# Patient Record
Sex: Male | Born: 1940 | Race: Black or African American | Hispanic: No | Smoking: Former smoker
Health system: Southern US, Community
[De-identification: ages and names within clinical notes are randomized; demographics above are authoritative.]

## PROBLEM LIST (undated history)

## (undated) DIAGNOSIS — F0390 Unspecified dementia without behavioral disturbance: Secondary | ICD-10-CM

## (undated) DIAGNOSIS — N189 Chronic kidney disease, unspecified: Secondary | ICD-10-CM

## (undated) DIAGNOSIS — M109 Gout, unspecified: Secondary | ICD-10-CM

## (undated) DIAGNOSIS — S72001A Fracture of unspecified part of neck of right femur, initial encounter for closed fracture: Secondary | ICD-10-CM

## (undated) DIAGNOSIS — I10 Essential (primary) hypertension: Secondary | ICD-10-CM

## (undated) DIAGNOSIS — I219 Acute myocardial infarction, unspecified: Secondary | ICD-10-CM

## (undated) DIAGNOSIS — C801 Malignant (primary) neoplasm, unspecified: Secondary | ICD-10-CM

## (undated) DIAGNOSIS — E119 Type 2 diabetes mellitus without complications: Secondary | ICD-10-CM

## (undated) HISTORY — PX: OTHER SURGICAL HISTORY: SHX169

## (undated) HISTORY — PX: PROSTATE SURGERY: SHX751

---

## 1995-06-11 DIAGNOSIS — Z8546 Personal history of malignant neoplasm of prostate: Secondary | ICD-10-CM

## 1999-07-11 ENCOUNTER — Encounter: Payer: Self-pay | Admitting: Family Medicine

## 1999-07-11 ENCOUNTER — Ambulatory Visit (HOSPITAL_COMMUNITY): Admission: RE | Admit: 1999-07-11 | Discharge: 1999-07-11 | Payer: Self-pay | Admitting: Family Medicine

## 2001-12-02 ENCOUNTER — Emergency Department (HOSPITAL_COMMUNITY): Admission: EM | Admit: 2001-12-02 | Discharge: 2001-12-03 | Payer: Self-pay | Admitting: Emergency Medicine

## 2001-12-09 ENCOUNTER — Emergency Department (HOSPITAL_COMMUNITY): Admission: EM | Admit: 2001-12-09 | Discharge: 2001-12-09 | Payer: Self-pay | Admitting: Emergency Medicine

## 2002-02-25 ENCOUNTER — Encounter: Payer: Self-pay | Admitting: Nephrology

## 2002-02-25 ENCOUNTER — Encounter: Admission: RE | Admit: 2002-02-25 | Discharge: 2002-02-25 | Payer: Self-pay | Admitting: Nephrology

## 2002-04-01 ENCOUNTER — Encounter: Payer: Self-pay | Admitting: Ophthalmology

## 2002-04-01 ENCOUNTER — Ambulatory Visit (HOSPITAL_COMMUNITY): Admission: RE | Admit: 2002-04-01 | Discharge: 2002-04-02 | Payer: Self-pay | Admitting: Ophthalmology

## 2003-03-07 ENCOUNTER — Encounter: Admission: RE | Admit: 2003-03-07 | Discharge: 2003-03-07 | Payer: Self-pay | Admitting: Urology

## 2003-03-07 ENCOUNTER — Encounter: Payer: Self-pay | Admitting: Urology

## 2004-04-24 ENCOUNTER — Ambulatory Visit: Payer: Self-pay | Admitting: Family Medicine

## 2004-05-08 ENCOUNTER — Ambulatory Visit: Payer: Self-pay | Admitting: Family Medicine

## 2004-05-15 ENCOUNTER — Ambulatory Visit: Payer: Self-pay | Admitting: Family Medicine

## 2004-08-19 ENCOUNTER — Inpatient Hospital Stay (HOSPITAL_COMMUNITY): Admission: EM | Admit: 2004-08-19 | Discharge: 2004-08-24 | Payer: Self-pay

## 2004-08-19 ENCOUNTER — Ambulatory Visit: Payer: Self-pay | Admitting: Internal Medicine

## 2004-08-27 ENCOUNTER — Ambulatory Visit: Payer: Self-pay | Admitting: Family Medicine

## 2004-09-03 ENCOUNTER — Ambulatory Visit: Payer: Self-pay | Admitting: Family Medicine

## 2004-10-03 ENCOUNTER — Ambulatory Visit: Payer: Self-pay | Admitting: Family Medicine

## 2004-10-23 ENCOUNTER — Ambulatory Visit (HOSPITAL_COMMUNITY): Admission: RE | Admit: 2004-10-23 | Discharge: 2004-10-24 | Payer: Self-pay | Admitting: Ophthalmology

## 2004-12-03 ENCOUNTER — Ambulatory Visit: Payer: Self-pay | Admitting: Family Medicine

## 2004-12-19 ENCOUNTER — Ambulatory Visit: Payer: Self-pay | Admitting: Family Medicine

## 2005-05-14 ENCOUNTER — Ambulatory Visit: Payer: Self-pay | Admitting: Family Medicine

## 2005-05-29 ENCOUNTER — Ambulatory Visit: Payer: Self-pay | Admitting: Internal Medicine

## 2005-09-12 ENCOUNTER — Emergency Department (HOSPITAL_COMMUNITY): Admission: EM | Admit: 2005-09-12 | Discharge: 2005-09-12 | Payer: Self-pay | Admitting: Emergency Medicine

## 2005-10-31 ENCOUNTER — Ambulatory Visit: Payer: Self-pay | Admitting: Family Medicine

## 2005-11-01 ENCOUNTER — Encounter: Payer: Self-pay | Admitting: Vascular Surgery

## 2005-11-01 ENCOUNTER — Ambulatory Visit: Admission: RE | Admit: 2005-11-01 | Discharge: 2005-11-01 | Payer: Self-pay | Admitting: Family Medicine

## 2005-11-12 ENCOUNTER — Ambulatory Visit: Payer: Self-pay | Admitting: Family Medicine

## 2006-01-07 ENCOUNTER — Ambulatory Visit: Payer: Self-pay | Admitting: Family Medicine

## 2006-02-27 ENCOUNTER — Ambulatory Visit: Payer: Self-pay | Admitting: Family Medicine

## 2006-03-25 ENCOUNTER — Ambulatory Visit: Payer: Self-pay | Admitting: Family Medicine

## 2006-04-26 ENCOUNTER — Emergency Department (HOSPITAL_COMMUNITY): Admission: EM | Admit: 2006-04-26 | Discharge: 2006-04-26 | Payer: Self-pay | Admitting: Emergency Medicine

## 2006-05-02 ENCOUNTER — Emergency Department (HOSPITAL_COMMUNITY): Admission: EM | Admit: 2006-05-02 | Discharge: 2006-05-02 | Payer: Self-pay | Admitting: Emergency Medicine

## 2006-09-23 ENCOUNTER — Ambulatory Visit: Payer: Self-pay | Admitting: Family Medicine

## 2006-09-26 ENCOUNTER — Ambulatory Visit (HOSPITAL_COMMUNITY): Admission: RE | Admit: 2006-09-26 | Discharge: 2006-09-26 | Payer: Self-pay | Admitting: Internal Medicine

## 2006-09-29 ENCOUNTER — Ambulatory Visit (HOSPITAL_COMMUNITY): Admission: RE | Admit: 2006-09-29 | Discharge: 2006-09-29 | Payer: Self-pay | Admitting: Family Medicine

## 2006-10-03 ENCOUNTER — Ambulatory Visit (HOSPITAL_COMMUNITY): Admission: RE | Admit: 2006-10-03 | Discharge: 2006-10-03 | Payer: Self-pay | Admitting: Family Medicine

## 2006-11-06 ENCOUNTER — Ambulatory Visit: Payer: Self-pay | Admitting: Internal Medicine

## 2007-01-07 ENCOUNTER — Emergency Department (HOSPITAL_COMMUNITY): Admission: EM | Admit: 2007-01-07 | Discharge: 2007-01-07 | Payer: Self-pay | Admitting: *Deleted

## 2007-03-30 ENCOUNTER — Telehealth (INDEPENDENT_AMBULATORY_CARE_PROVIDER_SITE_OTHER): Payer: Self-pay | Admitting: Family Medicine

## 2007-04-02 DIAGNOSIS — R319 Hematuria, unspecified: Secondary | ICD-10-CM | POA: Insufficient documentation

## 2007-04-02 DIAGNOSIS — E785 Hyperlipidemia, unspecified: Secondary | ICD-10-CM

## 2007-04-08 ENCOUNTER — Ambulatory Visit: Payer: Self-pay | Admitting: Family Medicine

## 2007-06-12 ENCOUNTER — Encounter (INDEPENDENT_AMBULATORY_CARE_PROVIDER_SITE_OTHER): Payer: Self-pay | Admitting: Family Medicine

## 2007-07-02 ENCOUNTER — Encounter (INDEPENDENT_AMBULATORY_CARE_PROVIDER_SITE_OTHER): Payer: Self-pay | Admitting: Family Medicine

## 2007-07-02 ENCOUNTER — Observation Stay (HOSPITAL_COMMUNITY): Admission: EM | Admit: 2007-07-02 | Discharge: 2007-07-03 | Payer: Self-pay | Admitting: Emergency Medicine

## 2007-07-10 ENCOUNTER — Telehealth (INDEPENDENT_AMBULATORY_CARE_PROVIDER_SITE_OTHER): Payer: Self-pay | Admitting: Family Medicine

## 2007-07-29 ENCOUNTER — Encounter (INDEPENDENT_AMBULATORY_CARE_PROVIDER_SITE_OTHER): Payer: Self-pay | Admitting: Family Medicine

## 2007-07-30 ENCOUNTER — Encounter (INDEPENDENT_AMBULATORY_CARE_PROVIDER_SITE_OTHER): Payer: Self-pay | Admitting: Family Medicine

## 2007-09-10 ENCOUNTER — Ambulatory Visit: Payer: Self-pay | Admitting: Internal Medicine

## 2007-09-10 ENCOUNTER — Encounter (INDEPENDENT_AMBULATORY_CARE_PROVIDER_SITE_OTHER): Payer: Self-pay | Admitting: Family Medicine

## 2007-09-10 ENCOUNTER — Emergency Department (HOSPITAL_COMMUNITY): Admission: EM | Admit: 2007-09-10 | Discharge: 2007-09-11 | Payer: Self-pay | Admitting: Emergency Medicine

## 2007-09-10 DIAGNOSIS — R55 Syncope and collapse: Secondary | ICD-10-CM | POA: Insufficient documentation

## 2007-09-10 DIAGNOSIS — D649 Anemia, unspecified: Secondary | ICD-10-CM

## 2007-09-10 DIAGNOSIS — E1129 Type 2 diabetes mellitus with other diabetic kidney complication: Secondary | ICD-10-CM | POA: Insufficient documentation

## 2007-09-10 LAB — CONVERTED CEMR LAB
Basophils Relative: 0 % (ref 0–1)
CO2: 26 meq/L (ref 19–32)
Calcium: 10.2 mg/dL (ref 8.4–10.5)
Chloride: 100 meq/L (ref 96–112)
Creatinine, Ser: 1.94 mg/dL — ABNORMAL HIGH (ref 0.40–1.50)
Eosinophils Absolute: 0.3 10*3/uL (ref 0.0–0.7)
Glucose, Bld: 109 mg/dL — ABNORMAL HIGH (ref 70–99)
HCT: 36.5 % — ABNORMAL LOW (ref 39.0–52.0)
Hemoglobin: 11.2 g/dL — ABNORMAL LOW (ref 13.0–17.0)
MCHC: 30.7 g/dL (ref 30.0–36.0)
Platelets: 267 10*3/uL (ref 150–400)
Potassium: 5 meq/L (ref 3.5–5.3)
RBC: 4.49 M/uL (ref 4.22–5.81)
Sodium: 139 meq/L (ref 135–145)
Total Bilirubin: 0.5 mg/dL (ref 0.3–1.2)
WBC: 7.3 10*3/uL (ref 4.0–10.5)

## 2007-09-11 ENCOUNTER — Encounter (INDEPENDENT_AMBULATORY_CARE_PROVIDER_SITE_OTHER): Payer: Self-pay | Admitting: Family Medicine

## 2007-09-11 ENCOUNTER — Telehealth (INDEPENDENT_AMBULATORY_CARE_PROVIDER_SITE_OTHER): Payer: Self-pay | Admitting: Internal Medicine

## 2007-09-22 ENCOUNTER — Telehealth (INDEPENDENT_AMBULATORY_CARE_PROVIDER_SITE_OTHER): Payer: Self-pay | Admitting: Family Medicine

## 2007-09-22 ENCOUNTER — Ambulatory Visit: Payer: Self-pay | Admitting: Internal Medicine

## 2007-09-22 DIAGNOSIS — N189 Chronic kidney disease, unspecified: Secondary | ICD-10-CM

## 2007-09-22 LAB — CONVERTED CEMR LAB
Blood Glucose, Fingerstick: 261
Iron: 75 ug/dL (ref 42–165)
Saturation Ratios: 22 % (ref 20–55)
TIBC: 347 ug/dL (ref 215–435)
UIBC: 272 ug/dL

## 2007-10-09 ENCOUNTER — Telehealth (INDEPENDENT_AMBULATORY_CARE_PROVIDER_SITE_OTHER): Payer: Self-pay | Admitting: Family Medicine

## 2007-10-23 ENCOUNTER — Inpatient Hospital Stay (HOSPITAL_COMMUNITY): Admission: EM | Admit: 2007-10-23 | Discharge: 2007-10-24 | Payer: Self-pay | Admitting: Emergency Medicine

## 2007-10-28 ENCOUNTER — Encounter (INDEPENDENT_AMBULATORY_CARE_PROVIDER_SITE_OTHER): Payer: Self-pay | Admitting: Family Medicine

## 2007-11-13 ENCOUNTER — Telehealth (INDEPENDENT_AMBULATORY_CARE_PROVIDER_SITE_OTHER): Payer: Self-pay | Admitting: Family Medicine

## 2007-11-16 ENCOUNTER — Telehealth (INDEPENDENT_AMBULATORY_CARE_PROVIDER_SITE_OTHER): Payer: Self-pay | Admitting: *Deleted

## 2007-11-23 ENCOUNTER — Encounter (INDEPENDENT_AMBULATORY_CARE_PROVIDER_SITE_OTHER): Payer: Self-pay | Admitting: Family Medicine

## 2007-12-22 ENCOUNTER — Ambulatory Visit: Payer: Self-pay | Admitting: Family Medicine

## 2007-12-22 DIAGNOSIS — R413 Other amnesia: Secondary | ICD-10-CM

## 2007-12-22 LAB — CONVERTED CEMR LAB
Blood Glucose, Fingerstick: 195
Hgb A1c MFr Bld: 7.8 %

## 2007-12-25 ENCOUNTER — Telehealth (INDEPENDENT_AMBULATORY_CARE_PROVIDER_SITE_OTHER): Payer: Self-pay | Admitting: Family Medicine

## 2007-12-28 ENCOUNTER — Encounter (INDEPENDENT_AMBULATORY_CARE_PROVIDER_SITE_OTHER): Payer: Self-pay | Admitting: Family Medicine

## 2008-02-22 ENCOUNTER — Ambulatory Visit: Payer: Self-pay | Admitting: Family Medicine

## 2008-02-22 DIAGNOSIS — R5381 Other malaise: Secondary | ICD-10-CM | POA: Insufficient documentation

## 2008-02-22 DIAGNOSIS — R5383 Other fatigue: Secondary | ICD-10-CM

## 2008-02-24 ENCOUNTER — Encounter (INDEPENDENT_AMBULATORY_CARE_PROVIDER_SITE_OTHER): Payer: Self-pay | Admitting: Family Medicine

## 2008-04-01 ENCOUNTER — Telehealth (INDEPENDENT_AMBULATORY_CARE_PROVIDER_SITE_OTHER): Payer: Self-pay | Admitting: Family Medicine

## 2008-04-01 LAB — CONVERTED CEMR LAB
AST: 16 units/L (ref 0–37)
Alkaline Phosphatase: 132 units/L — ABNORMAL HIGH (ref 39–117)
BUN: 31 mg/dL — ABNORMAL HIGH (ref 6–23)
Basophils Absolute: 0 10*3/uL (ref 0.0–0.1)
Calcium: 10.4 mg/dL (ref 8.4–10.5)
Chloride: 100 meq/L (ref 96–112)
Cholesterol: 157 mg/dL (ref 0–200)
Eosinophils Absolute: 0.8 10*3/uL — ABNORMAL HIGH (ref 0.0–0.7)
HDL: 40 mg/dL (ref >39)
LDL Cholesterol: 77 mg/dL (ref 0–99)
MCHC: 29.9 g/dL — ABNORMAL LOW (ref 30.0–36.0)
MCV: 84.4 fL (ref 78.0–100.0)
Monocytes Absolute: 0.5 10*3/uL (ref 0.1–1.0)
Neutro Abs: 3.7 10*3/uL (ref 1.7–7.7)
Neutrophils Relative %: 48 % (ref 43–77)
Potassium: 4.9 meq/L (ref 3.5–5.3)
RDW: 14.2 % (ref 11.5–15.5)
Sodium: 139 meq/L (ref 135–145)
TSH: 2.257 microintl units/mL (ref 0.350–4.50)
Triglycerides: 202 mg/dL — ABNORMAL HIGH (ref <150)

## 2008-04-08 ENCOUNTER — Ambulatory Visit: Payer: Self-pay | Admitting: Family Medicine

## 2008-04-25 ENCOUNTER — Telehealth (INDEPENDENT_AMBULATORY_CARE_PROVIDER_SITE_OTHER): Payer: Self-pay | Admitting: *Deleted

## 2008-04-26 LAB — CONVERTED CEMR LAB
Creatinine 24 HR UR: 1138 mg/24hr (ref 800–2000)
Creatinine Clearance: 45 mL/min — ABNORMAL LOW (ref 75–125)
Protein, Ur: 30 mg/24hr — ABNORMAL LOW (ref 50–100)

## 2008-05-02 ENCOUNTER — Encounter (INDEPENDENT_AMBULATORY_CARE_PROVIDER_SITE_OTHER): Payer: Self-pay | Admitting: Family Medicine

## 2008-05-24 ENCOUNTER — Encounter (INDEPENDENT_AMBULATORY_CARE_PROVIDER_SITE_OTHER): Payer: Self-pay | Admitting: Family Medicine

## 2008-06-06 ENCOUNTER — Encounter (INDEPENDENT_AMBULATORY_CARE_PROVIDER_SITE_OTHER): Payer: Self-pay | Admitting: Family Medicine

## 2008-06-21 ENCOUNTER — Encounter (INDEPENDENT_AMBULATORY_CARE_PROVIDER_SITE_OTHER): Payer: Self-pay | Admitting: Family Medicine

## 2008-07-05 ENCOUNTER — Telehealth (INDEPENDENT_AMBULATORY_CARE_PROVIDER_SITE_OTHER): Payer: Self-pay | Admitting: *Deleted

## 2008-10-19 ENCOUNTER — Telehealth (INDEPENDENT_AMBULATORY_CARE_PROVIDER_SITE_OTHER): Payer: Self-pay | Admitting: Family Medicine

## 2008-11-17 ENCOUNTER — Telehealth (INDEPENDENT_AMBULATORY_CARE_PROVIDER_SITE_OTHER): Payer: Self-pay | Admitting: Family Medicine

## 2008-11-23 ENCOUNTER — Telehealth (INDEPENDENT_AMBULATORY_CARE_PROVIDER_SITE_OTHER): Payer: Self-pay | Admitting: Internal Medicine

## 2008-11-24 ENCOUNTER — Encounter (INDEPENDENT_AMBULATORY_CARE_PROVIDER_SITE_OTHER): Payer: Self-pay | Admitting: Internal Medicine

## 2008-12-19 ENCOUNTER — Encounter (INDEPENDENT_AMBULATORY_CARE_PROVIDER_SITE_OTHER): Payer: Self-pay | Admitting: Internal Medicine

## 2009-01-03 ENCOUNTER — Telehealth (INDEPENDENT_AMBULATORY_CARE_PROVIDER_SITE_OTHER): Payer: Self-pay | Admitting: Internal Medicine

## 2009-01-03 DIAGNOSIS — N3941 Urge incontinence: Secondary | ICD-10-CM | POA: Insufficient documentation

## 2009-01-04 ENCOUNTER — Encounter (INDEPENDENT_AMBULATORY_CARE_PROVIDER_SITE_OTHER): Payer: Self-pay | Admitting: Internal Medicine

## 2009-05-09 ENCOUNTER — Encounter (INDEPENDENT_AMBULATORY_CARE_PROVIDER_SITE_OTHER): Payer: Self-pay | Admitting: Emergency Medicine

## 2009-05-09 ENCOUNTER — Ambulatory Visit: Payer: Self-pay | Admitting: Surgery

## 2009-05-09 ENCOUNTER — Emergency Department (HOSPITAL_COMMUNITY): Admission: EM | Admit: 2009-05-09 | Discharge: 2009-05-09 | Payer: Self-pay | Admitting: Emergency Medicine

## 2009-05-25 ENCOUNTER — Telehealth: Payer: Self-pay | Admitting: Physician Assistant

## 2009-05-30 ENCOUNTER — Telehealth (INDEPENDENT_AMBULATORY_CARE_PROVIDER_SITE_OTHER): Payer: Self-pay | Admitting: Internal Medicine

## 2009-06-23 ENCOUNTER — Encounter: Payer: Self-pay | Admitting: Physician Assistant

## 2009-07-28 ENCOUNTER — Encounter: Payer: Self-pay | Admitting: Physician Assistant

## 2009-12-15 ENCOUNTER — Inpatient Hospital Stay (HOSPITAL_COMMUNITY): Admission: EM | Admit: 2009-12-15 | Discharge: 2009-12-17 | Payer: Self-pay | Admitting: Emergency Medicine

## 2010-07-01 ENCOUNTER — Encounter: Payer: Self-pay | Admitting: Urology

## 2010-07-10 NOTE — Letter (Signed)
Summary: MAILED REQUESTED RECORDS TO [facility]   MAILED REQUESTED RECORDS TO [facility]   Imported By: Arta Bruce 07/28/2009 15:12:04  _____________________________________________________________________  External Attachment:    Type:   Image     Comment:   External Document

## 2010-07-10 NOTE — Letter (Signed)
Summary: MAILED REQUESTED RECORDS TO [facility]  MAILED REQUESTED RECORDS TO [facility]   Imported By: Arta Bruce 06/23/2009 14:51:20  _____________________________________________________________________  External Attachment:    Type:   Image     Comment:   External Document

## 2010-07-10 NOTE — Letter (Signed)
Summary: MAILED REQUESTED RECORDS TO [facility]  MAILED REQUESTED RECORDS TO [facility]   Imported By: Arta Bruce 07/28/2009 15:19:30  _____________________________________________________________________  External Attachment:    Type:   Image     Comment:   External Document

## 2010-08-26 LAB — CBC
HCT: 34.3 % — ABNORMAL LOW (ref 39.0–52.0)
HCT: 36.6 % — ABNORMAL LOW (ref 39.0–52.0)
Hemoglobin: 11.4 g/dL — ABNORMAL LOW (ref 13.0–17.0)
MCH: 26.6 pg (ref 26.0–34.0)
MCHC: 33.1 g/dL (ref 30.0–36.0)
RBC: 4.26 MIL/uL (ref 4.22–5.81)
RDW: 12.4 % (ref 11.5–15.5)
WBC: 8.4 10*3/uL (ref 4.0–10.5)

## 2010-08-26 LAB — DIFFERENTIAL
Basophils Absolute: 0 10*3/uL (ref 0.0–0.1)
Basophils Relative: 0 % (ref 0–1)
Eosinophils Absolute: 0.2 10*3/uL (ref 0.0–0.7)
Eosinophils Absolute: 0.5 10*3/uL (ref 0.0–0.7)
Lymphs Abs: 2 10*3/uL (ref 0.7–4.0)
Monocytes Absolute: 0.5 10*3/uL (ref 0.1–1.0)
Monocytes Relative: 7 % (ref 3–12)
Neutro Abs: 5.3 10*3/uL (ref 1.7–7.7)
Neutro Abs: 9.7 10*3/uL — ABNORMAL HIGH (ref 1.7–7.7)
Neutrophils Relative %: 63 % (ref 43–77)
Neutrophils Relative %: 83 % — ABNORMAL HIGH (ref 43–77)

## 2010-08-26 LAB — BASIC METABOLIC PANEL
CO2: 29 mEq/L (ref 19–32)
CO2: 30 mEq/L (ref 19–32)
Calcium: 9 mg/dL (ref 8.4–10.5)
Calcium: 9.2 mg/dL (ref 8.4–10.5)
Chloride: 96 mEq/L (ref 96–112)
Creatinine, Ser: 1.6 mg/dL — ABNORMAL HIGH (ref 0.4–1.5)
GFR calc Af Amer: 45 mL/min — ABNORMAL LOW (ref >60)
GFR calc non Af Amer: 37 mL/min — ABNORMAL LOW (ref >60)
GFR calc non Af Amer: 43 mL/min — ABNORMAL LOW (ref >60)
Glucose, Bld: 210 mg/dL — ABNORMAL HIGH (ref 70–99)
Glucose, Bld: 226 mg/dL — ABNORMAL HIGH (ref 70–99)
Potassium: 3.9 mEq/L (ref 3.5–5.1)
Potassium: 4.7 mEq/L (ref 3.5–5.1)
Sodium: 136 mEq/L (ref 135–145)
Sodium: 139 mEq/L (ref 135–145)
Sodium: 139 mEq/L (ref 135–145)

## 2010-08-26 LAB — URINALYSIS, ROUTINE W REFLEX MICROSCOPIC
Glucose, UA: >1000 mg/dL — AB
Leukocytes, UA: NEGATIVE
Nitrite: NEGATIVE
Protein, ur: NEGATIVE mg/dL
Urobilinogen, UA: 0.2 mg/dL (ref 0.0–1.0)

## 2010-08-26 LAB — LIPID PANEL
Cholesterol: 137 mg/dL (ref 0–200)
HDL: 40 mg/dL (ref >39)
Total CHOL/HDL Ratio: 3.4 RATIO
VLDL: 33 mg/dL (ref 0–40)

## 2010-08-26 LAB — CK TOTAL AND CKMB (NOT AT ARMC)
CK, MB: 2.1 ng/mL (ref 0.3–4.0)
Relative Index: 0.8 (ref 0.0–2.5)
Relative Index: 0.8 (ref 0.0–2.5)
Total CK: 254 U/L — ABNORMAL HIGH (ref 7–232)
Total CK: 284 U/L — ABNORMAL HIGH (ref 7–232)

## 2010-08-26 LAB — GLUCOSE, CAPILLARY
Glucose-Capillary: 173 mg/dL — ABNORMAL HIGH (ref 70–99)
Glucose-Capillary: 209 mg/dL — ABNORMAL HIGH (ref 70–99)
Glucose-Capillary: 220 mg/dL — ABNORMAL HIGH (ref 70–99)
Glucose-Capillary: 373 mg/dL — ABNORMAL HIGH (ref 70–99)

## 2010-08-26 LAB — CREATININE, URINE, RANDOM: Creatinine, Urine: 88.8 mg/dL

## 2010-08-26 LAB — TROPONIN I
Troponin I: 0.03 ng/mL (ref 0.00–0.06)
Troponin I: 0.03 ng/mL (ref 0.00–0.06)

## 2010-08-26 LAB — TSH: TSH: 1.063 u[IU]/mL (ref 0.350–4.500)

## 2010-08-26 LAB — CORTISOL: Cortisol, Plasma: 9.4 ug/dL

## 2010-08-26 LAB — POCT CARDIAC MARKERS: Myoglobin, poc: >500 ng/mL (ref 12–200)

## 2010-08-26 LAB — CK: Total CK: 263 U/L — ABNORMAL HIGH (ref 7–232)

## 2010-08-26 LAB — PHOSPHORUS: Phosphorus: 3.3 mg/dL (ref 2.3–4.6)

## 2010-08-26 LAB — URINE MICROSCOPIC-ADD ON

## 2010-08-26 LAB — C-REACTIVE PROTEIN: CRP: 0.3 mg/dL — ABNORMAL LOW (ref <0.6)

## 2010-08-26 LAB — HEMOGLOBIN A1C: Mean Plasma Glucose: 214 mg/dL — ABNORMAL HIGH (ref <117)

## 2010-08-26 LAB — SODIUM, URINE, RANDOM: Sodium, Ur: 79 mEq/L

## 2010-08-26 LAB — MAGNESIUM: Magnesium: 2.2 mg/dL (ref 1.5–2.5)

## 2010-09-12 LAB — GLUCOSE, CAPILLARY
Glucose-Capillary: 116 mg/dL — ABNORMAL HIGH (ref 70–99)
Glucose-Capillary: 72 mg/dL (ref 70–99)
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
## 2010-10-26 NOTE — Op Note (Signed)
NAME:  Carlos Schultz, Carlos Schultz                         ACCOUNT NO.:  [id number]   MEDICAL RECORD NO.:  [id number]                   PATIENT TYPE:  OIB   LOCATION:  2550                                 FACILITY:  [facility]   PHYSICIAN:  Beulah Gandy. Ashley Royalty, M.D.              DATE OF BIRTH:  11-02-1940   DATE OF PROCEDURE:  04/01/2002  DATE OF DISCHARGE:                                 OPERATIVE REPORT   PREOPERATIVE DIAGNOSIS:  Dislocated cataractous lens in the vitreous right  eye.   PROCEDURE:  Pars plana vitrectomy, retina photocoagulation, lensectomy,  iridectomy, secondary intraocular lens with suture, all in the right eye.   SURGEON:  Beulah Gandy. Ashley Royalty, M.D.   ASSISTANT:  Merian Capron and Lu Duffel.   ANESTHESIA:  General anesthesia.   DESCRIPTION OF PROCEDURE:  After proper endotracheal anesthesia, the usual  prep and drape was performed. Peritomies from 8 o'clock to 4 o'clock.  1 mm  square flaps raised at 3 and 9 o'clock for IOL suture placement.  Sclerotomy  points marked at 8, 10, and 2 o'clock. The 4 mm angled infusion port was  anchored into place at 8 o'clock.  The lighted pick and the cutter were  placed at 10 and 2 o'clock, respectively.  Contact lens ring anchored into  place at 6 and 12 o'clock.  Methylcellulose placed on the cornea and the  flat contact lens placed.  The pars plana vitrectomy was begun in the  pupillary plane and carried backward until large amounts of lens material  were encountered. These were carefully removed under low suction and rapid  cutting.  3/4 of the nucleus was present in the vitreous cavity.  The  phacofragment tongue was used to remove the nuclear fragments and the  vitrector was used for vitreous and lens cortex.  Once all of this was  removed and scleral depression was used to assure the same, the instruments  were removed from the eye and 9-0 nylon was used to sclerotomy sites.  The  indirect ophthalmoscope laser was moved into place and  laser marks were  placed around suspicious areas in the retinal periphery.  The power was 400  milliwatts, 1000 microns each, and 0.1 seconds each. 601 burns were placed.  The 10-0 Prolene was passed from 3 o'clock to 9 o'clock in front of the  capsular remnants and behind the iris.  The cornea was opened with a three-  layered wound and the sutures withdrawn out through the cornea.  They were  attached to the new intraocular lens model CA70BD, serial number [id number]  [employer] length 4.5 mm, optic 7.0 mm.  Expiration date  2008/08.  The implant was inspected, cleaned, and attached to sutures.  It  was placed in the posterior chamber and dialed into place.  There was iris  capture at 1 o'clock.  This was freed with the Cogan hook and the iris  was  trimmed with a small sector iridectomy in this area.  The implant was  sutured into place with the 10-0 Prolene sutures.  The cornea was closed  with seven interrupted 10-0 nylon sutures. The wound was found to be tight  with Leksell sticks.  The conjunctiva was closed with 7-0 chromic suture.  Polymixin and gentamicin were irrigated into tenons space. Atropine solution  was avoided in this case. Decadron 10 mg was injected into the lower  subconjunctival space. Marcaine was injected around the globe for  postoperative pain. Polysporin, a patch, and shield were placed. The closing  tension was 10 with a Barraquer tonometer. Complications were none.  Duration is two hours.  The patient was awakened and taken to the recovery  room in satisfactory condition.                                               Beulah Gandy. Ashley Royalty, M.D.   JDM/MEDQ  D:  04/01/2002  T:  04/01/2002  Job:  [id number]

## 2010-10-26 NOTE — Op Note (Signed)
Carlos Schultz, Carlos Schultz               ACCOUNT NO.:  [id number]   MEDICAL RECORD NO.:  [id number]          PATIENT TYPE:  OIB   LOCATION:  2872                         FACILITY:  [facility]   PHYSICIAN:  Beulah Gandy. Ashley Royalty, M.D. 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   PROCEDURES:  1.  Pars plana vitrectomy,  2.  Panretinal photocoagulation.  3.  Placement of secondary intraocular lens with suture, left eye.   SURGEON:  Beulah Gandy. Ashley Royalty, M.D.   ASSISTANT:  Rosalie Doctor, MA   ANESTHESIA:  General.   DETAILS:  Usual prep and drape.  Peritomy from 8 o'clock around to 4  o'clock.  Half-thickness scleral flaps were raised at 3 o'clock and  9  o'clock, in anticipation of lens suture at the limbus.  Then 25 gauge  trocars placed at 10, 2 and 4 o'clock, with infusion at 4 o'clock.  Provisc  placed on the corneal surface. The pars plana vitrectomy was performed with  a Biome viewing system and the lighted pick.  The posterior membranes were  encountered. The posterior hyaloid was engaged and stripped from its  attachment to the disk in the macular region.  Posterior membranes were  peeled.  Once all the vitreous and blood was removed, and lens material was  removed,  the instruments were removed from the eye and plugs were placed.  Two 10-0 Prolene sutures were placed from 3 to 9 o'clock beneath the scleral  flaps in the ciliary sulcus behind the iris.  A corneal wound was created  from 10 o'clock to 2 o'clock in three-layered fashion. The sutures were  externalized to the pupil and out  through the  limbal wound.  An  intraocular lens made by  [employer] CZ70BD; power 22.0  diopters, length 12.5 mm, optic 7.0 mm; Serial Number [id number]) was  brought onto the field and cleaned.  Expiration date was December 2009. The  sutures were attached to the eyelids of the lens.  The lens was inserted  into the posterior chamber and dialed into place. The sutures were knotted  externally beneath the flaps and the free ends removed.  The corneal wound  was closed with seven interrupted 10-0 sutures.  It was tested and found to  be tight. Additional vitrectomy was performed at this point, removing all  debris from the vitreous cavity. The trocars were removed and a 10-0 nylon  was placed through the two o'clock trocar point.  All the wounds were tested  and found to be tight.  Conjunctiva was closed with 7-0 chromic suture. The  indirect ophthalmoscope laser was moved into place; 1597 burns placed around  the retinal periphery with a power between 300 and 500 mW 1000 microns each  and 0.05 seconds each. The wounds were tested.  The sutures were trimmed.  Polymyxin and gentamicin were irrigated into Tenon's space.  Decadron 10 mg  was injected  into the lower subconjunctival space. Marcaine was injected  around the globe for postoperative pain. TobraDex ophthalmic ointment was  placed. The closing tension was 15 with a Barraquer tonometer.  A patch and  shield were placed.  The patient was awakened and taken to recovery in  satisfactory condition.   COMPLICATIONS:  None.   DURATION:  2 hours.      JDM/MEDQ  D:  10/23/2004  T:  10/23/2004  Job:  [id number]

## 2011-03-01 LAB — CBC
HCT: 32.7 — ABNORMAL LOW
HCT: 38.2 — ABNORMAL LOW
Hemoglobin: 12.7 — ABNORMAL LOW
MCHC: 33
MCV: 79.8
Platelets: 193
RBC: 4.11 — ABNORMAL LOW
RBC: 4.85
RDW: 12.2
WBC: 6.9
WBC: 7.8

## 2011-03-01 LAB — COMPREHENSIVE METABOLIC PANEL
Albumin: 4.2
Alkaline Phosphatase: 100
BUN: 19
CO2: 25
Chloride: 104
GFR calc non Af Amer: 47 — ABNORMAL LOW
Potassium: 6.2 — ABNORMAL HIGH
Total Bilirubin: 0.8

## 2011-03-01 LAB — DIFFERENTIAL
Basophils Absolute: 0
Eosinophils Relative: 11 — ABNORMAL HIGH
Lymphocytes Relative: 24
Lymphs Abs: 1.6
Monocytes Absolute: 0.5
Monocytes Relative: 7
Neutro Abs: 4

## 2011-03-01 LAB — PROTIME-INR: INR: 0.9

## 2011-03-05 LAB — POCT I-STAT, CHEM 8
HCT: 35 — ABNORMAL LOW
Hemoglobin: 11.9 — ABNORMAL LOW
Sodium: 137
TCO2: 28

## 2011-03-05 LAB — CBC
HCT: 33 — ABNORMAL LOW
MCV: 78.5
Platelets: 256
RDW: 13.6

## 2011-03-05 LAB — DIFFERENTIAL
Basophils Absolute: 0
Eosinophils Absolute: 0.5
Eosinophils Relative: 5

## 2011-03-05 LAB — POCT CARDIAC MARKERS: Operator id: 234501

## 2011-03-06 LAB — BASIC METABOLIC PANEL
BUN: 12
CO2: 26
Chloride: 106
Creatinine, Ser: 1.11

## 2011-03-06 LAB — HEMOGLOBIN A1C: Mean Plasma Glucose: 193

## 2011-03-25 LAB — I-STAT 8, (EC8 V) (CONVERTED LAB)
Bicarbonate: 27.8 — ABNORMAL HIGH
Glucose, Bld: 44 — ABNORMAL LOW
Potassium: 5.1
TCO2: 29
pH, Ven: 7.448 — ABNORMAL HIGH

## 2012-06-12 ENCOUNTER — Other Ambulatory Visit (HOSPITAL_COMMUNITY): Payer: Self-pay | Admitting: Urology

## 2012-06-12 DIAGNOSIS — C61 Malignant neoplasm of prostate: Secondary | ICD-10-CM

## 2012-06-19 ENCOUNTER — Encounter (HOSPITAL_COMMUNITY)
Admission: RE | Admit: 2012-06-19 | Discharge: 2012-06-19 | Disposition: A | Discharge status: Home / Self Care | Payer: PRIVATE HEALTH INSURANCE | Source: Ambulatory Visit | Attending: Urology | Admitting: Urology

## 2012-06-19 DIAGNOSIS — C61 Malignant neoplasm of prostate: Secondary | ICD-10-CM

## 2012-06-19 DIAGNOSIS — M25519 Pain in unspecified shoulder: Secondary | ICD-10-CM | POA: Insufficient documentation

## 2012-06-19 MED ORDER — TECHNETIUM TC 99M MEDRONATE IV KIT
25.2000 | PACK | Freq: Once | INTRAVENOUS | Status: AC | PRN
  Administered 2012-06-19: 25.2 via INTRAVENOUS

## 2012-10-29 ENCOUNTER — Encounter (HOSPITAL_COMMUNITY): Payer: Self-pay

## 2012-10-29 ENCOUNTER — Emergency Department (HOSPITAL_COMMUNITY)
Admission: EM | Admit: 2012-10-29 | Discharge: 2012-10-29 | Disposition: A | Discharge status: Home / Self Care | Payer: PRIVATE HEALTH INSURANCE | Attending: Emergency Medicine | Admitting: Emergency Medicine

## 2012-10-29 DIAGNOSIS — I10 Essential (primary) hypertension: Secondary | ICD-10-CM | POA: Insufficient documentation

## 2012-10-29 DIAGNOSIS — M79609 Pain in unspecified limb: Secondary | ICD-10-CM | POA: Insufficient documentation

## 2012-10-29 DIAGNOSIS — M109 Gout, unspecified: Secondary | ICD-10-CM | POA: Insufficient documentation

## 2012-10-29 DIAGNOSIS — E119 Type 2 diabetes mellitus without complications: Secondary | ICD-10-CM | POA: Insufficient documentation

## 2012-10-29 DIAGNOSIS — Z87891 Personal history of nicotine dependence: Secondary | ICD-10-CM | POA: Insufficient documentation

## 2012-10-29 DIAGNOSIS — R413 Other amnesia: Secondary | ICD-10-CM | POA: Insufficient documentation

## 2012-10-29 DIAGNOSIS — C61 Malignant neoplasm of prostate: Secondary | ICD-10-CM | POA: Insufficient documentation

## 2012-10-29 DIAGNOSIS — M79605 Pain in left leg: Secondary | ICD-10-CM

## 2012-10-29 HISTORY — DX: Essential (primary) hypertension: I10

## 2012-10-29 HISTORY — DX: Unspecified dementia, unspecified severity, without behavioral disturbance, psychotic disturbance, mood disturbance, and anxiety: F03.90

## 2012-10-29 HISTORY — DX: Gout, unspecified: M10.9

## 2012-10-29 HISTORY — DX: Malignant (primary) neoplasm, unspecified: C80.1

## 2012-10-29 HISTORY — DX: Type 2 diabetes mellitus without complications: E11.9

## 2012-10-29 LAB — COMPREHENSIVE METABOLIC PANEL
AST: 24 U/L (ref 0–37)
Alkaline Phosphatase: 97 U/L (ref 39–117)
CO2: 31 mEq/L (ref 19–32)
Chloride: 98 mEq/L (ref 96–112)
Creatinine, Ser: 1.49 mg/dL — ABNORMAL HIGH (ref 0.50–1.35)
GFR calc non Af Amer: 45 mL/min — ABNORMAL LOW (ref >90)
Potassium: 4.1 mEq/L (ref 3.5–5.1)
Total Bilirubin: 0.4 mg/dL (ref 0.3–1.2)

## 2012-10-29 LAB — URINALYSIS, ROUTINE W REFLEX MICROSCOPIC
Bilirubin Urine: NEGATIVE
Ketones, ur: NEGATIVE mg/dL
Leukocytes, UA: NEGATIVE
Nitrite: NEGATIVE
Protein, ur: NEGATIVE mg/dL
Urobilinogen, UA: 0.2 mg/dL (ref 0.0–1.0)

## 2012-10-29 LAB — CBC WITH DIFFERENTIAL/PLATELET
Basophils Absolute: 0 10*3/uL (ref 0.0–0.1)
Basophils Relative: 0 % (ref 0–1)
Eosinophils Absolute: 0.5 10*3/uL (ref 0.0–0.7)
Eosinophils Relative: 7 % — ABNORMAL HIGH (ref 0–5)
Lymphs Abs: 1.9 10*3/uL (ref 0.7–4.0)
MCH: 26.2 pg (ref 26.0–34.0)
MCHC: 32.1 g/dL (ref 30.0–36.0)
Neutrophils Relative %: 59 % (ref 43–77)
Platelets: 252 10*3/uL (ref 150–400)
RBC: 4.31 MIL/uL (ref 4.22–5.81)
RDW: 13.1 % (ref 11.5–15.5)

## 2012-10-29 LAB — D-DIMER, QUANTITATIVE: D-Dimer, Quant: <0.27 ug/mL-FEU (ref 0.00–0.48)

## 2012-10-29 MED ORDER — HYDROCODONE-ACETAMINOPHEN 5-325 MG PO TABS: 1.0000 | ORAL_TABLET | Freq: Three times a day (TID) | ORAL | Status: DC | PRN

## 2012-10-29 NOTE — ED Notes (Signed)
Patient states that he had pain in bilateral legs and feet that lasted all night long. States that he has had this pain before but it did not last all night long. Patient has DM and hx of MI.

## 2012-10-29 NOTE — ED Notes (Signed)
Patient unable to give a comprehensive medical hx. States that he does not have a good memory.
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
## 2013-02-12 ENCOUNTER — Other Ambulatory Visit (HOSPITAL_COMMUNITY): Payer: Self-pay | Admitting: Urology

## 2013-02-12 DIAGNOSIS — C61 Malignant neoplasm of prostate: Secondary | ICD-10-CM

## 2013-02-12 DIAGNOSIS — R972 Elevated prostate specific antigen [PSA]: Secondary | ICD-10-CM

## 2013-03-03 ENCOUNTER — Encounter (HOSPITAL_COMMUNITY): Payer: PRIVATE HEALTH INSURANCE

## 2013-03-03 ENCOUNTER — Ambulatory Visit (HOSPITAL_COMMUNITY): Payer: PRIVATE HEALTH INSURANCE

## 2013-03-03 ENCOUNTER — Encounter (HOSPITAL_COMMUNITY)
Admission: RE | Admit: 2013-03-03 | Discharge: 2013-03-03 | Disposition: A | Discharge status: Home / Self Care | Payer: PRIVATE HEALTH INSURANCE | Source: Ambulatory Visit | Attending: Urology | Admitting: Urology

## 2013-03-03 DIAGNOSIS — C61 Malignant neoplasm of prostate: Secondary | ICD-10-CM | POA: Insufficient documentation

## 2013-03-03 DIAGNOSIS — R972 Elevated prostate specific antigen [PSA]: Secondary | ICD-10-CM

## 2013-03-03 MED ORDER — TECHNETIUM TC 99M MEDRONATE IV KIT
25.5000 | PACK | Freq: Once | INTRAVENOUS | Status: AC | PRN
  Administered 2013-03-03: 25.5 via INTRAVENOUS

## 2013-11-03 ENCOUNTER — Emergency Department (INDEPENDENT_AMBULATORY_CARE_PROVIDER_SITE_OTHER)
Admission: EM | Admit: 2013-11-03 | Discharge: 2013-11-03 | Disposition: A | Discharge status: Home / Self Care | Payer: PRIVATE HEALTH INSURANCE | Source: Home / Self Care

## 2013-11-03 ENCOUNTER — Encounter (HOSPITAL_COMMUNITY): Payer: Self-pay | Admitting: Emergency Medicine

## 2013-11-03 DIAGNOSIS — R739 Hyperglycemia, unspecified: Secondary | ICD-10-CM

## 2013-11-03 DIAGNOSIS — E119 Type 2 diabetes mellitus without complications: Secondary | ICD-10-CM

## 2013-11-03 DIAGNOSIS — R7309 Other abnormal glucose: Secondary | ICD-10-CM

## 2013-11-03 DIAGNOSIS — R358 Other polyuria: Secondary | ICD-10-CM

## 2013-11-03 DIAGNOSIS — R3589 Other polyuria: Secondary | ICD-10-CM

## 2013-11-03 LAB — POCT URINALYSIS DIP (DEVICE)
BILIRUBIN URINE: NEGATIVE
GLUCOSE, UA: 100 mg/dL — AB
HGB URINE DIPSTICK: NEGATIVE
KETONES UR: NEGATIVE mg/dL
NITRITE: NEGATIVE
Protein, ur: NEGATIVE mg/dL
SPECIFIC GRAVITY, URINE: 1.01 (ref 1.005–1.030)
Urobilinogen, UA: 0.2 mg/dL (ref 0.0–1.0)
pH: 6.5 (ref 5.0–8.0)

## 2013-11-03 LAB — GLUCOSE, CAPILLARY: Glucose-Capillary: 444 mg/dL — ABNORMAL HIGH (ref 70–99)

## 2013-11-03 NOTE — ED Notes (Signed)
C/o has to urinate frequently. Unable to get in to see Dr Janice Norrie at present. History of prostate CA

## 2013-11-03 NOTE — Discharge Instructions (Signed)
Give yourself your usual dose of insulin when you get home and twice a day. Always check blood sugar before administering insulin. For now do not administer if BS is less than 175.   Glucose, Blood Sugar, Fasting Blood Sugar This is a test to measure your blood sugar. Glucose is a simple sugar that serves as the main source of energy for the body. The carbohydrates we eat are broken down into glucose (and a few other simple sugars), absorbed by the small intestine, and circulated throughout the body. Most of the body's cells require glucose for energy production; brain and nervous system cells not only rely on glucose for energy, they can only function when glucose levels in the blood remain above a certain level.  The body's use of glucose hinges on the availability of insulin, a hormone produced by the pancreas. Insulin acts as a Control and instrumentation engineer, transporting glucose into the body's cells, directing the body to store excess glucose as glycogen (for short-term storage) and/or as triglycerides in fat cells. We can not live without glucose or insulin, and they must be in balance.  Normally, blood glucose levels rise slightly after a meal, and insulin is secreted to lower them, with the amount of insulin released matched up with the size and content of the meal. If blood glucose levels drop too low, such as might occur in between meals or after a strenuous workout, glucagon (another pancreatic hormone) is secreted to tell the liver to turn some glycogen back into glucose, raising the blood glucose levels. If the glucose/insulin feedback mechanism is working properly, the amount of glucose in the blood remains fairly stable. If the balance is disrupted and glucose levels in the blood rise, then the body tries to restore the balance, both by increasing insulin production and by excreting glucose in the urine.  PREPARATION FOR TEST A blood sample drawn from a vein in your arm or, for a self check, a drop of blood  from a skin prick; in general, it may be recommended that you fast before having a blood glucose test; sometimes a random (no preparation) urine sample is used. Your caregiver will instruct you as to what they want prior to your testing. NORMAL FINDINGS Normal values depend on many factors. Your lab will provide a range of normal values with your test results. The following information summarizes the meaning of the test results. These are based on the clinical practice recommendations of the American Diabetes Association.  FASTING BLOOD GLUCOSE  From 70 to 99 mg/dL (3.9 to 5.5 mmol/L): Normal glucose tolerance  From 100 to 125 mg/dL (5.6 to 6.9 mmol/L):Impaired fasting glucose (pre-diabetes)  126 mg/dL (7.0 mmol/L) and above on more than one testing occasion: Diabetes ORAL GLUCOSE TOLERANCE TEST (OGTT) [EXCEPT PREGNANCY] (2 HOURS AFTER A 75-GRAM GLUCOSE DRINK)  Less than 140 mg/dL (7.8 mmol/L): Normal glucose tolerance  From 140 to 200 mg/dL (7.8 to 11.1 mmol/L): Impaired glucose tolerance (pre-diabetes)  Over 200 mg/dL (11.1 mmol/L) on more than one testing occasion: Diabetes GESTATIONAL DIABETES SCREENING: GLUCOSE CHALLENGE TEST (1 HOUR AFTER A 50-GRAM GLUCOSE DRINK)  Less than 140* mg/dL (7.8 mmol/L): Normal glucose tolerance  140* mg/dL (7.8 mmol/L) and over: Abnormal, needs OGTT (see below) * Some use a cutoff of More Than 130 mg/dL (7.2 mmol/L) because that identifies 90% of women with gestational diabetes, compared to 80% identified using the threshold of More Than 140 mg/dL (7.8 mmol/L). GESTATIONAL DIABETES DIAGNOSTIC: OGTT (100-GRAM GLUCOSE DRINK)  Fasting*..........................................95 mg/dL (5.3  mmol/L)  1 hour after glucose load*..............180 mg/dL (10.0 mmol/L)  2 hours after glucose load*.............155 mg/dL (8.6 mmol/L)  3 hours after glucose load* **.........140 mg/dL (7.8 mmol/L) * If two or more values are above the criteria, gestational diabetes  is diagnosed. ** A 75-gram glucose load may be used, although this method is not as well validated as the 100-gram OGTT; the 3-hour sample is not drawn if 75 grams is used.  Ranges for normal findings may vary among different laboratories and hospitals. You should always check with your doctor after having lab work or other tests done to discuss the meaning of your test results and whether your values are considered within normal limits. MEANING OF TEST  Your caregiver will go over the test results with you and discuss the importance and meaning of your results, as well as treatment options and the need for additional tests if necessary. OBTAINING THE TEST RESULTS It is your responsibility to obtain your test results. Ask the lab or department performing the test when and how you will get your results. Document Released: 06/28/2004 Document Revised: 08/19/2011 Document Reviewed: 05/07/2008 [facility] Patient Information 2014 [place], [place].  Correction Insulin Your health care provider has decided you need to take insulin regularly. You have been given a correction scale (also called a sliding scale) in case you need extra insulin when your blood sugar is too high (hyperglycemia). The following instructions will assist you in how to use that correction scale.  WHAT IS A CORRECTION SCALE?  When you check your blood sugar, sometimes it will be higher than your health care provider has told you it should be. You may need an extra dose of insulin to bring your blood sugar to the recommended level (also known as your goal, target, or normal level). The correction scale is prescribed by your health care provider based on your specific needs.  Your correction scale has two parts:   The first shows you a blood sugar range.   The second part tells you how much extra insulin to give yourself if your blood sugar falls within this range. You will not need an extra dose of insulin if your blood glucose is  in the desired range. You should simply give yourself the normal amount of insulin that your health care provider has ordered for you.  WHY IS IT IMPORTANT TO KEEP YOUR BLOOD SUGAR LEVELS AT YOUR DESIRED LEVEL?  Keeping your blood sugar at the desired level helps to prevent long-term complications of diabetes, such as eye disease, kidney failure, nerve damage, and other serious complications. WHAT TYPE OF INSULIN WILL YOU USE?  To help bring down blood sugar levels that are too high, your health care provider will prescribe a short-acting or a rapid-acting insulin. An example of a short-acting insulin would be regular insulin. Remember, you may also have a longer-acting insulin prescribed for you.  WHAT DO YOU NEED TO DO?   Check your blood sugar with your home blood glucose meter as recommended by your health care provider.   Using your correction scale, find the range that your blood sugar lies in.   Look for the units of insulin that match that blood sugar range. Give yourself the dose of correction insulin your health care provider has prescribed. Always make sure you are using the right type of insulin.   Prior to the injection, make sure you have food available that you can eat in the next 15 30 minutes.   If your  correction insulin is rapid acting, start eating your meal within 15 minutes after you have given yourself the insulin injection. If you wait longer than 15 minutes to eat, your blood sugar might get too low.   If your correction insulin is short acting(regular), start eating your meal within 30 minutes after you have given yourself the insulin injection. If you wait longer than 30 minutes to eat, your blood sugar might get too low. Symptoms of low blood sugar (hypoglycemia) may include feeling shaky or weak, sweating, feeling confused, difficulty seeing, agitation, crankiness, or numbness of the lips or tongue. Check your blood sugar immediately and treat your results as  directed by your health care provider.   Keep a log of your blood sugar results with the time you took the test and the amount of insulin that you injected. This information will help your health care provider manage your medicines.   Note on your log anything that may affect your blood sugar level, such as:   Changes in normal exercise or activity.   Changes in your normal schedule, such as staying up late, going on vacation, changing your diet, or holidays.   New medicines. This includes prescription and over-the-counter medicines. Some medicines may cause high blood sugar.   Sickness, stress, or anxiety.   Changes in the time you took your medicine.   Changes in your meals, such as skipping a meal, having a late meal, or dining out.   Eating things that may affect blood glucose, such as snacks, meal portions that are larger than normal, drinks with sugar, or eating less than usual.   Ask your health care provider any questions you have.  Be aware of "stacking" your insulin doses. This happens when you correct a high blood sugar level by giving yourself extra insulin too soon after a previous correction dose or mealtime dose. You may then have too much insulin still active in your body and may be at risk for hypoglycemia. WHY DO YOU NEED A CORRECTION SCALE IF YOU HAVE NEVER BEEN DIAGNOSED WITH DIABETES?   Keeping your blood glucose in the target range is important for your overall health.   You may have been prescribed medicines that cause your blood glucose to be higher than normal. WHEN SHOULD YOU SEEK MEDICAL CARE? Contact your health care provider if:   You have experienced hypoglycemia that you are unable to treat with your usual routine.   You have a high blood sugar level that is not coming down with the correction dose.  Your blood sugar is often too low or does not come up even if you eat a fast-acting carbohydrate. Someone who lives with you should seek  immediate medical care if you become unresponsive. Document Released: 10/18/2010 Document Revised: 01/27/2013 Document Reviewed: 11/06/2012 [facility] Patient Information 2014 [place].
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
## 2013-11-04 NOTE — ED Provider Notes (Signed)
Medical screening examination/treatment/procedure(s) were performed by a resident physician or non-physician practitioner and as the supervising physician I was immediately available for consultation/collaboration.  Gillian Meeuwsen, MD    Yaakov Saindon S Faith Branan, MD 11/04/13 1445 

## 2013-12-06 ENCOUNTER — Other Ambulatory Visit (HOSPITAL_COMMUNITY): Payer: Self-pay | Admitting: Urology

## 2013-12-06 DIAGNOSIS — C61 Malignant neoplasm of prostate: Secondary | ICD-10-CM

## 2013-12-23 ENCOUNTER — Encounter (HOSPITAL_COMMUNITY)
Admission: RE | Admit: 2013-12-23 | Discharge: 2013-12-23 | Disposition: A | Discharge status: Home / Self Care | Payer: PRIVATE HEALTH INSURANCE | Source: Ambulatory Visit | Attending: Urology | Admitting: Urology

## 2013-12-23 ENCOUNTER — Ambulatory Visit (HOSPITAL_COMMUNITY)
Admission: RE | Admit: 2013-12-23 | Discharge: 2013-12-23 | Disposition: A | Discharge status: Home / Self Care | Payer: PRIVATE HEALTH INSURANCE | Source: Ambulatory Visit | Attending: Urology | Admitting: Urology

## 2013-12-23 DIAGNOSIS — C61 Malignant neoplasm of prostate: Secondary | ICD-10-CM | POA: Diagnosis present

## 2013-12-23 MED ORDER — TECHNETIUM TC 99M MEDRONATE IV KIT
25.2000 | PACK | Freq: Once | INTRAVENOUS | Status: AC | PRN
  Administered 2013-12-23: 25.2 via INTRAVENOUS

## 2013-12-28 ENCOUNTER — Telehealth: Payer: Self-pay | Admitting: Oncology

## 2013-12-28 NOTE — Telephone Encounter (Signed)
S/W PATIENT AND GAVE NP APPT FOR 07/28 @ 10:30 W/DR. SHADAD.  REFERRING DR. MARC NESI DX- PROSTATE CA

## 2013-12-28 NOTE — Telephone Encounter (Signed)
C/D 12/28/13 for appt. 01/04/14

## 2013-12-31 ENCOUNTER — Other Ambulatory Visit: Payer: Self-pay | Admitting: Oncology

## 2013-12-31 DIAGNOSIS — C61 Malignant neoplasm of prostate: Secondary | ICD-10-CM

## 2014-01-03 ENCOUNTER — Telehealth: Payer: Self-pay | Admitting: Medical Oncology

## 2014-01-03 NOTE — Telephone Encounter (Signed)
Courtesy call to patient regarding tomorrow's appt. Patient confirms appt and knows to bring a current list of all his medications. Patient denies questions at this time.

## 2014-01-04 ENCOUNTER — Telehealth: Payer: Self-pay | Admitting: Oncology

## 2014-01-04 ENCOUNTER — Other Ambulatory Visit (HOSPITAL_BASED_OUTPATIENT_CLINIC_OR_DEPARTMENT_OTHER): Payer: PRIVATE HEALTH INSURANCE

## 2014-01-04 ENCOUNTER — Ambulatory Visit (HOSPITAL_BASED_OUTPATIENT_CLINIC_OR_DEPARTMENT_OTHER): Payer: PRIVATE HEALTH INSURANCE | Admitting: Oncology

## 2014-01-04 ENCOUNTER — Ambulatory Visit: Payer: PRIVATE HEALTH INSURANCE

## 2014-01-04 ENCOUNTER — Encounter: Payer: Self-pay | Admitting: Oncology

## 2014-01-04 VITALS — BP 123/65 | HR 75 | Temp 97.9°F | Resp 18 | Wt 207.0 lb

## 2014-01-04 DIAGNOSIS — F0390 Unspecified dementia without behavioral disturbance: Secondary | ICD-10-CM

## 2014-01-04 DIAGNOSIS — C61 Malignant neoplasm of prostate: Secondary | ICD-10-CM

## 2014-01-04 DIAGNOSIS — E119 Type 2 diabetes mellitus without complications: Secondary | ICD-10-CM

## 2014-01-04 LAB — CBC WITH DIFFERENTIAL/PLATELET
BASO%: 0.5 % (ref 0.0–2.0)
Basophils Absolute: 0 10*3/uL (ref 0.0–0.1)
EOS ABS: 0.5 10*3/uL (ref 0.0–0.5)
EOS%: 6.6 % (ref 0.0–7.0)
HCT: 34.5 % — ABNORMAL LOW (ref 38.4–49.9)
HGB: 10.7 g/dL — ABNORMAL LOW (ref 13.0–17.1)
LYMPH%: 27.7 % (ref 14.0–49.0)
MCH: 26 pg — ABNORMAL LOW (ref 27.2–33.4)
MCHC: 31 g/dL — ABNORMAL LOW (ref 32.0–36.0)
MCV: 83.9 fL (ref 79.3–98.0)
MONO#: 0.4 10*3/uL (ref 0.1–0.9)
MONO%: 5.5 % (ref 0.0–14.0)
NEUT%: 59.7 % (ref 39.0–75.0)
NEUTROS ABS: 4.8 10*3/uL (ref 1.5–6.5)
NRBC: 0 % (ref 0–0)
PLATELETS: 213 10*3/uL (ref 140–400)
RBC: 4.11 10*6/uL — AB (ref 4.20–5.82)
RDW: 14.4 % (ref 11.0–14.6)
WBC: 8 10*3/uL (ref 4.0–10.3)
lymph#: 2.2 10*3/uL (ref 0.9–3.3)

## 2014-01-04 LAB — COMPREHENSIVE METABOLIC PANEL (CC13)
ALK PHOS: 85 U/L (ref 40–150)
ALT: 16 U/L (ref 0–55)
AST: 19 U/L (ref 5–34)
Albumin: 3.9 g/dL (ref 3.5–5.0)
Anion Gap: 9 mEq/L (ref 3–11)
BUN: 26.6 mg/dL — AB (ref 7.0–26.0)
CALCIUM: 10.2 mg/dL (ref 8.4–10.4)
CO2: 30 mEq/L — ABNORMAL HIGH (ref 22–29)
Chloride: 100 mEq/L (ref 98–109)
Creatinine: 1.9 mg/dL — ABNORMAL HIGH (ref 0.7–1.3)
GLUCOSE: 154 mg/dL — AB (ref 70–140)
Potassium: 4.1 mEq/L (ref 3.5–5.1)
Sodium: 139 mEq/L (ref 136–145)
TOTAL PROTEIN: 7.6 g/dL (ref 6.4–8.3)
Total Bilirubin: 0.31 mg/dL (ref 0.20–1.20)

## 2014-01-04 NOTE — Progress Notes (Signed)
Checked in new patient with no issues prior to seeing the dr. He has appt card and has not been out of the country

## 2014-01-04 NOTE — Telephone Encounter (Signed)
Pt confirmed labs/ov per 07/28 POF, gave pt AVS....KJ °
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
## 2014-01-04 NOTE — Progress Notes (Signed)
Please see consult note.  

## 2014-01-05 LAB — TESTOSTERONE: TESTOSTERONE: 50 ng/dL — AB (ref 300–890)

## 2014-01-05 LAB — PSA: PSA: 28.5 ng/mL — AB (ref <=4.00)

## 2014-03-14 ENCOUNTER — Other Ambulatory Visit: Payer: Self-pay | Admitting: Medical Oncology

## 2014-03-14 DIAGNOSIS — C61 Malignant neoplasm of prostate: Secondary | ICD-10-CM

## 2014-03-15 ENCOUNTER — Other Ambulatory Visit: Payer: PRIVATE HEALTH INSURANCE

## 2014-03-15 ENCOUNTER — Ambulatory Visit (HOSPITAL_COMMUNITY): Payer: PRIVATE HEALTH INSURANCE

## 2014-03-16 ENCOUNTER — Other Ambulatory Visit (HOSPITAL_BASED_OUTPATIENT_CLINIC_OR_DEPARTMENT_OTHER): Payer: PRIVATE HEALTH INSURANCE

## 2014-03-16 ENCOUNTER — Ambulatory Visit: Payer: PRIVATE HEALTH INSURANCE | Admitting: Oncology

## 2014-03-16 ENCOUNTER — Encounter (HOSPITAL_COMMUNITY): Payer: Self-pay

## 2014-03-16 ENCOUNTER — Ambulatory Visit (HOSPITAL_COMMUNITY)
Admission: RE | Admit: 2014-03-16 | Discharge: 2014-03-16 | Disposition: A | Discharge status: Home / Self Care | Payer: PRIVATE HEALTH INSURANCE | Source: Ambulatory Visit | Attending: Oncology | Admitting: Oncology

## 2014-03-16 ENCOUNTER — Telehealth: Payer: Self-pay | Admitting: Medical Oncology

## 2014-03-16 DIAGNOSIS — C61 Malignant neoplasm of prostate: Secondary | ICD-10-CM | POA: Insufficient documentation

## 2014-03-16 LAB — COMPREHENSIVE METABOLIC PANEL (CC13)
ALBUMIN: 3.7 g/dL (ref 3.5–5.0)
ALT: 9 U/L (ref 0–55)
AST: 10 U/L (ref 5–34)
Alkaline Phosphatase: 96 U/L (ref 40–150)
Anion Gap: 8 mEq/L (ref 3–11)
BILIRUBIN TOTAL: 0.38 mg/dL (ref 0.20–1.20)
BUN: 33.9 mg/dL — AB (ref 7.0–26.0)
CO2: 28 mEq/L (ref 22–29)
CREATININE: 2 mg/dL — AB (ref 0.7–1.3)
Calcium: 10 mg/dL (ref 8.4–10.4)
Chloride: 98 mEq/L (ref 98–109)
GLUCOSE: 505 mg/dL — AB (ref 70–140)
POTASSIUM: 4.2 meq/L (ref 3.5–5.1)
Sodium: 134 mEq/L — ABNORMAL LOW (ref 136–145)
Total Protein: 7.1 g/dL (ref 6.4–8.3)

## 2014-03-16 MED ORDER — IOHEXOL 300 MG/ML  SOLN
50.0000 mL | Freq: Once | INTRAMUSCULAR | Status: AC | PRN
  Administered 2014-03-16: 50 mL via ORAL

## 2014-03-16 NOTE — Telephone Encounter (Signed)
1600: Attempted to call patient to inform him of his Glucose lab result today @ 505 so patient can follow-up with his PCP, phone number with no option to leave vm.  Patient here today for labs/CT.  F/U appt with Dr Alen Blew 10/09.  3428: Patient's sister called inquiring about appt., states she was with patient, I informed her of patient's glucose results and she states with have him recheck and f/u with PCP if needed.   Confirmed appt for Friday.

## 2014-03-17 LAB — PSA: PSA: 29.58 ng/mL — ABNORMAL HIGH (ref <=4.00)

## 2014-03-18 ENCOUNTER — Telehealth: Payer: Self-pay | Admitting: Oncology

## 2014-03-18 ENCOUNTER — Ambulatory Visit (HOSPITAL_BASED_OUTPATIENT_CLINIC_OR_DEPARTMENT_OTHER): Payer: PRIVATE HEALTH INSURANCE | Admitting: Oncology

## 2014-03-18 VITALS — BP 138/49 | HR 86 | Temp 98.3°F | Resp 18 | Ht <= 58 in | Wt 205.9 lb

## 2014-03-18 DIAGNOSIS — Z8546 Personal history of malignant neoplasm of prostate: Secondary | ICD-10-CM

## 2014-03-18 NOTE — Telephone Encounter (Signed)
gv adn printed appt sched and avs fo rpt fro Jan 2016

## 2014-03-18 NOTE — Progress Notes (Signed)
Hematology and Oncology Follow Up Visit  Carlos Schultz [id number] 1941-03-02 73 y.o. 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 He was diagnosed in 1997 with a PSA of 16 and a Gleason score 4+3 equals 7.   Prior Therapy: He was treated initially with radioactive seed implants under the care of Dr. Reece Schultz as well as Dr. Danny Schultz  He developed biochemical relapse at some point and was treated with androgen depravation with Lupron and subsequently Lupron and Casodex for the last 8 years. His PSA was under reasonable control but his PSA started to rise recently. I n April 2014 his PSA was 5.91 and the number went up to 10.45 in January of 2015 and most recently his PSA was 28.5 in June of 2015. His doubling time is clearly less than 6 months. His bone scan was done in July of 2015 and showed no metastasis  Current therapy: Deprivation and anti-androgen withdrawal.  Interim History:  Carlos Schultz presents today for a followup visit. Since his last visit, he underwent staging workup including a CT scan and a repeat PSA. His PSA continue to be stable on 29 and his imaging studies did not show any measurable disease. He continues to report symptoms of nocturia and frequency but otherwise he reports no symptoms. He does not report any fevers or chills or sweats. Does not report any weight loss or appetite changes.  He does not report any chest pain or shortness of breath or palpitation or orthopnea. He does not report any cough or hemoptysis or wheezing. Is not reporting any nausea or vomiting or abdominal pain. Has not reported hematochezia or melena. Doesn't report any constipation or diarrhea. He is not reporting any hematuria or dysuria. He report no arthralgias or myalgias or back pain. He does not report any lymphadenopathy or petechiae. Rest of his review of systems  unremarkable.   Medications: I have reviewed the patient's current medications.  Current Outpatient Prescriptions  Medication Sig Dispense Refill  . allopurinol (ZYLOPRIM) 300 MG tablet Take 300 mg by mouth daily.      Marland Kitchen aspirin 81 MG tablet Take 81 mg by mouth daily.      . benazepril (LOTENSIN) 40 MG tablet Take 40 mg by mouth daily.      Marland Kitchen donepezil (ARICEPT) 10 MG tablet Take 10 mg by mouth daily.      . furosemide (LASIX) 40 MG tablet Take 120 mg by mouth 2 (two) times daily. Pt taking 3 tablets twice per day      . insulin aspart protamine- aspart (NOVOLOG 70/30) (70-30) 100 UNIT/ML injection Inject 30 Units into the skin 2 (two) times daily as needed (Patient injects 35 units twice a day as needed for blood sugar > 250).        No current facility-administered medications for this visit.     Allergies: No Known Allergies  Past Medical History, Surgical history, Social history, and Family History were reviewed and updated.   Physical Exam: Blood pressure 138/49, pulse 86, temperature 98.3 F (36.8 C), temperature source Oral, resp. rate 18, height 4\' 8"  (1.422 m), weight 205 lb 14.4 oz (93.396 kg), SpO2 98.00%. ECOG: 1 General appearance: alert and cooperative Head: Normocephalic, without obvious abnormality Neck: no adenopathy Lymph nodes: Cervical, supraclavicular, and axillary nodes normal. Heart:regular rate and rhythm, S1, S2 normal, no murmur, click, rub or gallop Lung:chest clear, no wheezing, rales, normal  symmetric air entry Abdomin: soft, non-tender, without masses or organomegaly EXT:no erythema, induration, or nodules   Lab Results: Lab Results  Component Value Date   WBC 8.0 01/04/2014   HGB 10.7* 01/04/2014   HCT 34.5* 01/04/2014   MCV 83.9 01/04/2014   PLT 213 01/04/2014     Chemistry      Component Value Date/Time   NA 134* 03/16/2014 1216   NA 139 10/29/2012 1250   K 4.2 03/16/2014 1216   K 4.1 10/29/2012 1250   CL 98 10/29/2012 1250   CO2 28  03/16/2014 1216   CO2 31 10/29/2012 1250   BUN 33.9* 03/16/2014 1216   BUN 28* 10/29/2012 1250   CREATININE 2.0* 03/16/2014 1216   CREATININE 1.49* 10/29/2012 1250      Component Value Date/Time   CALCIUM 10.0 03/16/2014 1216   CALCIUM 10.0 10/29/2012 1250   ALKPHOS 96 03/16/2014 1216   ALKPHOS 97 10/29/2012 1250   AST 10 03/16/2014 1216   AST 24 10/29/2012 1250   ALT 9 03/16/2014 1216   ALT 19 10/29/2012 1250   BILITOT 0.38 03/16/2014 1216   BILITOT 0.4 10/29/2012 1250       Radiological Studies: Ct Chest Wo Contrast  03/16/2014   CLINICAL DATA:  Malignant neoplasm of prostate C61 (ICD-10-CM). Cough, mid abdominal pain and rectal pressure risk frequent urination and diarrhea.  EXAM: CT PELVIS WITHOUT CONTRAST  TECHNIQUE: Multidetector CT imaging of the chest and pelvis was performed following the standard protocol without IV contrast.  COMPARISON:  Bone scan 12/23/2013 and CT abdomen pelvis 09/26/2006.  CT CHEST FINDINGS: Mediastinal lymph nodes are not enlarged by CT size criteria. Hilar regions are difficult to definitively evaluate without IV contrast but appear grossly unremarkable. No axillary adenopathy. Atherosclerotic calcification of the arterial vasculature, including three-vessel involvement of the coronary arteries. Heart size within normal limits. No pericardial effusion.  Centrilobular and paraseptal emphysema. A focal bed of emphysema is seen in the superior segment left lower lobe, without associated thickening or nodularity. Lungs are otherwise clear. No pleural fluid. Airway is unremarkable.  Esophagus is dilated throughout its course.  Incidental imaging of the upper abdomen shows the visualized portions of the liver, gallbladder, adrenal glands, upper kidneys, upper spleen, pancreas and stomach to be grossly unremarkable. No upper abdominal adenopathy.  No worrisome lytic or sclerotic lesions. Degenerative changes are seen in the spine with flowing anterior osteophytosis. Slight cortical  thickening involving the posterior left seventh rib may be due to Paget's disease.  CT pelvis  FINDINGS:  Brachytherapy seeds are seen in the prostate. Bladder is grossly unremarkable. Distal ureters are decompressed. Visualized portions of the distal descending colon, rectosigmoid colon cecum and proximal ascending colon contain stool. Colon is otherwise not visualized. Visualized portions of the small bowel are grossly unremarkable. Atherosclerotic calcification of the arterial vasculature. No pathologically enlarged lymph nodes. No free fluid.  No worrisome lytic or sclerotic lesions. Degenerative changes are seen in the spine.  IMPRESSION: 1. No acute findings in the chest. 2. Question constipation.  Colon is incompletely visualized. 3. Brachytherapy seeds in the prostate. No evidence of metastatic disease in the chest or pelvis. 4. Esophageal dilatation can be seen in the setting of dysmotility.   Electronically Signed   By: Lorin Picket M.D.   On: 03/16/2014 15:44   Ct Pelvis Wo Contrast  03/16/2014   CLINICAL DATA:  Malignant neoplasm of prostate C61 (ICD-10-CM). Cough, mid abdominal pain and rectal pressure risk frequent urination and diarrhea.  EXAM: CT PELVIS WITHOUT CONTRAST  TECHNIQUE: Multidetector CT imaging of the chest and pelvis was performed following the standard protocol without IV contrast.  COMPARISON:  Bone scan 12/23/2013 and CT abdomen pelvis 09/26/2006.  CT CHEST FINDINGS: Mediastinal lymph nodes are not enlarged by CT size criteria. Hilar regions are difficult to definitively evaluate without IV contrast but appear grossly unremarkable. No axillary adenopathy. Atherosclerotic calcification of the arterial vasculature, including three-vessel involvement of the coronary arteries. Heart size within normal limits. No pericardial effusion.  Centrilobular and paraseptal emphysema. A focal bed of emphysema is seen in the superior segment left lower lobe, without associated thickening or  nodularity. Lungs are otherwise clear. No pleural fluid. Airway is unremarkable.  Esophagus is dilated throughout its course.  Incidental imaging of the upper abdomen shows the visualized portions of the liver, gallbladder, adrenal glands, upper kidneys, upper spleen, pancreas and stomach to be grossly unremarkable. No upper abdominal adenopathy.  No worrisome lytic or sclerotic lesions. Degenerative changes are seen in the spine with flowing anterior osteophytosis. Slight cortical thickening involving the posterior left seventh rib may be due to Paget's disease.  CT pelvis  FINDINGS:  Brachytherapy seeds are seen in the prostate. Bladder is grossly unremarkable. Distal ureters are decompressed. Visualized portions of the distal descending colon, rectosigmoid colon cecum and proximal ascending colon contain stool. Colon is otherwise not visualized. Visualized portions of the small bowel are grossly unremarkable. Atherosclerotic calcification of the arterial vasculature. No pathologically enlarged lymph nodes. No free fluid.  No worrisome lytic or sclerotic lesions. Degenerative changes are seen in the spine.  IMPRESSION: 1. No acute findings in the chest. 2. Question constipation.  Colon is incompletely visualized. 3. Brachytherapy seeds in the prostate. No evidence of metastatic disease in the chest or pelvis. 4. Esophageal dilatation can be seen in the setting of dysmotility.   Electronically Signed   By: Lorin Picket M.D.   On: 03/16/2014 15:44     Impression and Plan:   73 year old gentleman with the following issues:  1. Castration resistant prostate cancer without any evidence of metastasis. His staging workup including a bone scan and a CT scan did not show any evidence of measurable disease. His PSA has stabilized in the last 3 months and I see no clear-cut evidence of disease progression. Options of treatment were discussed today including second line hormonal manipulation versus observation and  surveillance. Given the lack of symptoms at this time and the fact that he is really poor insight to his disease and the fact that he is already on multiple medications I will defer any treatment for the time being unless he develops measurable disease. For the time being, I will continue active surveillance and repeat his PSA in about 3 months.  2. Antigen deprivation: I recommended continued at the care of Dr. Janice Norrie.   3. Urinary frequency: Recommend that he contact Dr. Janice Norrie.    [email or web address],ZPHXT, MD 10/9/20151:39 PM

## 2014-05-22 ENCOUNTER — Encounter (HOSPITAL_COMMUNITY): Payer: Self-pay | Admitting: *Deleted

## 2014-05-22 ENCOUNTER — Other Ambulatory Visit: Payer: Self-pay

## 2014-05-22 ENCOUNTER — Emergency Department (HOSPITAL_COMMUNITY)
Admission: EM | Admit: 2014-05-22 | Discharge: 2014-05-22 | Disposition: A | Discharge status: Home / Self Care | Payer: PRIVATE HEALTH INSURANCE | Attending: Emergency Medicine | Admitting: Emergency Medicine

## 2014-05-22 ENCOUNTER — Emergency Department (HOSPITAL_COMMUNITY): Payer: PRIVATE HEALTH INSURANCE

## 2014-05-22 DIAGNOSIS — X58XXXA Exposure to other specified factors, initial encounter: Secondary | ICD-10-CM | POA: Insufficient documentation

## 2014-05-22 DIAGNOSIS — Y998 Other external cause status: Secondary | ICD-10-CM | POA: Diagnosis not present

## 2014-05-22 DIAGNOSIS — Z8739 Personal history of other diseases of the musculoskeletal system and connective tissue: Secondary | ICD-10-CM | POA: Insufficient documentation

## 2014-05-22 DIAGNOSIS — Z87891 Personal history of nicotine dependence: Secondary | ICD-10-CM | POA: Diagnosis not present

## 2014-05-22 DIAGNOSIS — Z8701 Personal history of pneumonia (recurrent): Secondary | ICD-10-CM | POA: Diagnosis not present

## 2014-05-22 DIAGNOSIS — Y9389 Activity, other specified: Secondary | ICD-10-CM | POA: Insufficient documentation

## 2014-05-22 DIAGNOSIS — Z79899 Other long term (current) drug therapy: Secondary | ICD-10-CM | POA: Insufficient documentation

## 2014-05-22 DIAGNOSIS — Z7982 Long term (current) use of aspirin: Secondary | ICD-10-CM | POA: Diagnosis not present

## 2014-05-22 DIAGNOSIS — Y9289 Other specified places as the place of occurrence of the external cause: Secondary | ICD-10-CM | POA: Insufficient documentation

## 2014-05-22 DIAGNOSIS — S62639A Displaced fracture of distal phalanx of unspecified finger, initial encounter for closed fracture: Secondary | ICD-10-CM

## 2014-05-22 DIAGNOSIS — I252 Old myocardial infarction: Secondary | ICD-10-CM | POA: Diagnosis not present

## 2014-05-22 DIAGNOSIS — F0390 Unspecified dementia without behavioral disturbance: Secondary | ICD-10-CM | POA: Diagnosis not present

## 2014-05-22 DIAGNOSIS — R4182 Altered mental status, unspecified: Secondary | ICD-10-CM | POA: Diagnosis present

## 2014-05-22 DIAGNOSIS — E11649 Type 2 diabetes mellitus with hypoglycemia without coma: Secondary | ICD-10-CM | POA: Insufficient documentation

## 2014-05-22 DIAGNOSIS — I10 Essential (primary) hypertension: Secondary | ICD-10-CM | POA: Insufficient documentation

## 2014-05-22 DIAGNOSIS — Z794 Long term (current) use of insulin: Secondary | ICD-10-CM | POA: Insufficient documentation

## 2014-05-22 DIAGNOSIS — R609 Edema, unspecified: Secondary | ICD-10-CM

## 2014-05-22 DIAGNOSIS — E162 Hypoglycemia, unspecified: Secondary | ICD-10-CM

## 2014-05-22 DIAGNOSIS — Z859 Personal history of malignant neoplasm, unspecified: Secondary | ICD-10-CM | POA: Diagnosis not present

## 2014-05-22 DIAGNOSIS — S62636A Displaced fracture of distal phalanx of right little finger, initial encounter for closed fracture: Secondary | ICD-10-CM | POA: Diagnosis not present

## 2014-05-22 LAB — CBC WITH DIFFERENTIAL/PLATELET
Basophils Absolute: 0 10*3/uL (ref 0.0–0.1)
Basophils Relative: 0 % (ref 0–1)
EOS ABS: 0.2 10*3/uL (ref 0.0–0.7)
EOS PCT: 4 % (ref 0–5)
HCT: 34.4 % — ABNORMAL LOW (ref 39.0–52.0)
Hemoglobin: 10.6 g/dL — ABNORMAL LOW (ref 13.0–17.0)
LYMPHS ABS: 1 10*3/uL (ref 0.7–4.0)
LYMPHS PCT: 16 % (ref 12–46)
MCH: 25.6 pg — ABNORMAL LOW (ref 26.0–34.0)
MCHC: 30.8 g/dL (ref 30.0–36.0)
MCV: 83.1 fL (ref 78.0–100.0)
MONOS PCT: 8 % (ref 3–12)
Monocytes Absolute: 0.5 10*3/uL (ref 0.1–1.0)
Neutro Abs: 4.5 10*3/uL (ref 1.7–7.7)
Neutrophils Relative %: 72 % (ref 43–77)
PLATELETS: 270 10*3/uL (ref 150–400)
RBC: 4.14 MIL/uL — AB (ref 4.22–5.81)
RDW: 13.8 % (ref 11.5–15.5)
WBC: 6.3 10*3/uL (ref 4.0–10.5)

## 2014-05-22 LAB — COMPREHENSIVE METABOLIC PANEL
ALBUMIN: 3.8 g/dL (ref 3.5–5.2)
ALT: 19 U/L (ref 0–53)
AST: 43 U/L — ABNORMAL HIGH (ref 0–37)
Alkaline Phosphatase: 83 U/L (ref 39–117)
Anion gap: 14 (ref 5–15)
BUN: 13 mg/dL (ref 6–23)
CALCIUM: 9.9 mg/dL (ref 8.4–10.5)
CO2: 21 mEq/L (ref 19–32)
CREATININE: 1.25 mg/dL (ref 0.50–1.35)
Chloride: 103 mEq/L (ref 96–112)
GFR calc Af Amer: 64 mL/min — ABNORMAL LOW (ref >90)
GFR calc non Af Amer: 55 mL/min — ABNORMAL LOW (ref >90)
Glucose, Bld: 106 mg/dL — ABNORMAL HIGH (ref 70–99)
Potassium: 4.9 mEq/L (ref 3.7–5.3)
Sodium: 138 mEq/L (ref 137–147)
Total Bilirubin: 0.2 mg/dL — ABNORMAL LOW (ref 0.3–1.2)
Total Protein: 7.5 g/dL (ref 6.0–8.3)

## 2014-05-22 LAB — CBG MONITORING, ED: GLUCOSE-CAPILLARY: 113 mg/dL — AB (ref 70–99)

## 2014-05-22 NOTE — Discharge Instructions (Signed)
Cast or Splint Care Casts and splints support injured limbs and keep bones from moving while they heal.  HOME CARE  Keep the cast or splint uncovered during the drying period.  A plaster cast can take 24 to 48 hours to dry.  A fiberglass cast will dry in less than 1 hour.  Do not rest the cast on anything harder than a pillow for 24 hours.  Do not put weight on your injured limb. Do not put pressure on the cast. Wait for your doctor's approval.  Keep the cast or splint dry.  Cover the cast or splint with a plastic bag during baths or wet weather.  If you have a cast over your chest and belly (trunk), take sponge baths until the cast is taken off.  If your cast gets wet, dry it with a towel or blow dryer. Use the cool setting on the blow dryer.  Keep your cast or splint clean. Wash a dirty cast with a damp cloth.  Do not put any objects under your cast or splint.  Do not scratch the skin under the cast with an object. If itching is a problem, use a blow dryer on a cool setting over the itchy area.  Do not trim or cut your cast.  Do not take out the padding from inside your cast.  Exercise your joints near the cast as told by your doctor.  Raise (elevate) your injured limb on 1 or 2 pillows for the first 1 to 3 days. GET HELP IF:  Your cast or splint cracks.  Your cast or splint is too tight or too loose.  You itch badly under the cast.  Your cast gets wet or has a soft spot.  You have a bad smell coming from the cast.  You get an object stuck under the cast.  Your skin around the cast becomes red or sore.  You have new or more pain after the cast is put on. GET HELP RIGHT AWAY IF:  You have fluid leaking through the cast.  You cannot move your fingers or toes.  Your fingers or toes turn blue or white or are cool, painful, or puffy (swollen).  You have tingling or lose feeling (numbness) around the injured area.  You have bad pain or pressure under the  cast.  You have trouble breathing or have shortness of breath.  You have chest pain. Document Released: 09/26/2010 Document Revised: 01/27/2013 Document Reviewed: 12/03/2012 [facility] Patient Information 2015 [place], [place]. This information is not intended to replace advice given to you by your health care provider. Make sure you discuss any questions you have with your health care provider.  Low Blood Sugar Low blood sugar (hypoglycemia) means that the level of sugar in your blood is lower than it should be. Signs of low blood sugar include:  Getting sweaty.  Feeling hungry.  Feeling dizzy or weak.  Feeling sleepier than normal.  Feeling nervous.  Headaches.  Having a fast heartbeat. Low blood sugar can happen fast and can be an emergency. Your doctor can do tests to check your blood sugar level. You can have low blood sugar and not have diabetes. HOME CARE  Check your blood sugar as told by your doctor. If it is less than 70 mg/dl or as told by your doctor, take 1 of the following:  3 to 4 glucose tablets.   cup clear juice.   cup soda pop, not diet.  1 cup milk.  5 to  6 hard candies.  Recheck blood sugar after 15 minutes. Repeat until it is at the right level.  Eat a snack if it is more than 1 hour until the next meal.  Only take medicine as told by your doctor.  Do not skip meals. Eat on time.  Do not drink alcohol except with meals.  Check your blood glucose before driving.  Check your blood glucose before and after exercise.  Always carry treatment with you, such as glucose pills.  Always wear a medical alert bracelet if you have diabetes. GET HELP RIGHT AWAY IF:   Your blood glucose goes below 70 mg/dl or as told by your doctor, and you:  Are confused.  Are not able to swallow.  Pass out (faint).  You cannot treat yourself. You may need someone to help you.  You have low blood sugar problems often.  You have problems from your  medicines.  You are not feeling better after 3 to 4 days.  You have vision changes. MAKE SURE YOU:   Understand these instructions.  Will watch this condition.  Will get help right away if you are not doing well or get worse. Document Released: 08/21/2009 Document Revised: 08/19/2011 Document Reviewed: 08/21/2009 [facility] Patient Information 2015 [place], [place]. This information is not intended to replace advice given to you by your health care provider. Make sure you discuss any questions you have with your health care provider.

## 2014-05-22 NOTE — ED Notes (Signed)
Pt states he took 34 units of insulin daily.  Pt states he has felt weak and dizzy since yesterday.  EMS states his CBG was 34 and they gave him a sandwich and orange juice.  Pt states he has stopped all of his medication in July since he "felt better".

## 2014-05-22 NOTE — Progress Notes (Signed)
Orthopedic Tech Progress Note Patient Details:  Carlos Schultz [birth year] [id number]  Ortho Devices Type of Ortho Device: Finger splint Ortho Device/Splint Location: RUE Ortho Device/Splint Interventions: Ordered, Application   [name] 05/22/2014, 9:32 PM

## 2014-05-23 ENCOUNTER — Emergency Department (HOSPITAL_COMMUNITY): Payer: PRIVATE HEALTH INSURANCE

## 2014-05-23 ENCOUNTER — Observation Stay (HOSPITAL_COMMUNITY)
Admission: EM | Admit: 2014-05-23 | Discharge: 2014-05-25 | Disposition: A | Discharge status: Skilled Nursing Facility | Payer: PRIVATE HEALTH INSURANCE | Attending: Internal Medicine | Admitting: Internal Medicine

## 2014-05-23 ENCOUNTER — Inpatient Hospital Stay (HOSPITAL_COMMUNITY): Payer: PRIVATE HEALTH INSURANCE

## 2014-05-23 ENCOUNTER — Encounter (HOSPITAL_COMMUNITY): Payer: Self-pay | Admitting: Emergency Medicine

## 2014-05-23 DIAGNOSIS — G8929 Other chronic pain: Secondary | ICD-10-CM | POA: Insufficient documentation

## 2014-05-23 DIAGNOSIS — E162 Hypoglycemia, unspecified: Secondary | ICD-10-CM

## 2014-05-23 DIAGNOSIS — R55 Syncope and collapse: Secondary | ICD-10-CM

## 2014-05-23 DIAGNOSIS — S62609A Fracture of unspecified phalanx of unspecified finger, initial encounter for closed fracture: Secondary | ICD-10-CM | POA: Diagnosis present

## 2014-05-23 DIAGNOSIS — F0390 Unspecified dementia without behavioral disturbance: Secondary | ICD-10-CM | POA: Insufficient documentation

## 2014-05-23 DIAGNOSIS — Z8546 Personal history of malignant neoplasm of prostate: Secondary | ICD-10-CM | POA: Diagnosis not present

## 2014-05-23 DIAGNOSIS — Y9289 Other specified places as the place of occurrence of the external cause: Secondary | ICD-10-CM | POA: Insufficient documentation

## 2014-05-23 DIAGNOSIS — M109 Gout, unspecified: Secondary | ICD-10-CM | POA: Diagnosis not present

## 2014-05-23 DIAGNOSIS — M545 Low back pain, unspecified: Secondary | ICD-10-CM

## 2014-05-23 DIAGNOSIS — R059 Cough, unspecified: Secondary | ICD-10-CM

## 2014-05-23 DIAGNOSIS — I2510 Atherosclerotic heart disease of native coronary artery without angina pectoris: Secondary | ICD-10-CM | POA: Diagnosis not present

## 2014-05-23 DIAGNOSIS — T383X4A Poisoning by insulin and oral hypoglycemic [antidiabetic] drugs, undetermined, initial encounter: Principal | ICD-10-CM | POA: Insufficient documentation

## 2014-05-23 DIAGNOSIS — R296 Repeated falls: Secondary | ICD-10-CM

## 2014-05-23 DIAGNOSIS — R600 Localized edema: Secondary | ICD-10-CM

## 2014-05-23 DIAGNOSIS — Z794 Long term (current) use of insulin: Secondary | ICD-10-CM | POA: Diagnosis not present

## 2014-05-23 DIAGNOSIS — R413 Other amnesia: Secondary | ICD-10-CM | POA: Diagnosis present

## 2014-05-23 DIAGNOSIS — N3941 Urge incontinence: Secondary | ICD-10-CM | POA: Diagnosis not present

## 2014-05-23 DIAGNOSIS — D649 Anemia, unspecified: Secondary | ICD-10-CM | POA: Insufficient documentation

## 2014-05-23 DIAGNOSIS — N390 Urinary tract infection, site not specified: Secondary | ICD-10-CM | POA: Insufficient documentation

## 2014-05-23 DIAGNOSIS — E11649 Type 2 diabetes mellitus with hypoglycemia without coma: Secondary | ICD-10-CM | POA: Diagnosis not present

## 2014-05-23 DIAGNOSIS — Z9181 History of falling: Secondary | ICD-10-CM | POA: Diagnosis not present

## 2014-05-23 DIAGNOSIS — I10 Essential (primary) hypertension: Secondary | ICD-10-CM | POA: Diagnosis not present

## 2014-05-23 DIAGNOSIS — S62306D Unspecified fracture of fifth metacarpal bone, right hand, subsequent encounter for fracture with routine healing: Secondary | ICD-10-CM | POA: Insufficient documentation

## 2014-05-23 DIAGNOSIS — F09 Unspecified mental disorder due to known physiological condition: Secondary | ICD-10-CM

## 2014-05-23 DIAGNOSIS — W19XXXA Unspecified fall, initial encounter: Secondary | ICD-10-CM

## 2014-05-23 DIAGNOSIS — W19XXXD Unspecified fall, subsequent encounter: Secondary | ICD-10-CM | POA: Diagnosis not present

## 2014-05-23 DIAGNOSIS — R05 Cough: Secondary | ICD-10-CM

## 2014-05-23 HISTORY — DX: Acute myocardial infarction, unspecified: I21.9

## 2014-05-23 LAB — URINALYSIS, ROUTINE W REFLEX MICROSCOPIC
BILIRUBIN URINE: NEGATIVE
Glucose, UA: NEGATIVE mg/dL
KETONES UR: NEGATIVE mg/dL
Nitrite: NEGATIVE
PROTEIN: NEGATIVE mg/dL
SPECIFIC GRAVITY, URINE: 1.009 (ref 1.005–1.030)
UROBILINOGEN UA: 0.2 mg/dL (ref 0.0–1.0)
pH: 7 (ref 5.0–8.0)

## 2014-05-23 LAB — URINE MICROSCOPIC-ADD ON

## 2014-05-23 LAB — COMPREHENSIVE METABOLIC PANEL WITH GFR
ALT: 21 U/L (ref 0–53)
AST: 43 U/L — ABNORMAL HIGH (ref 0–37)
Albumin: 4.1 g/dL (ref 3.5–5.2)
Alkaline Phosphatase: 80 U/L (ref 39–117)
Anion gap: 15 (ref 5–15)
BUN: 12 mg/dL (ref 6–23)
CO2: 23 meq/L (ref 19–32)
Calcium: 10.2 mg/dL (ref 8.4–10.5)
Chloride: 104 meq/L (ref 96–112)
Creatinine, Ser: 1.13 mg/dL (ref 0.50–1.35)
GFR calc Af Amer: 72 mL/min — ABNORMAL LOW
GFR calc non Af Amer: 62 mL/min — ABNORMAL LOW
Glucose, Bld: 43 mg/dL — CL (ref 70–99)
Potassium: 4.8 meq/L (ref 3.7–5.3)
Sodium: 142 meq/L (ref 137–147)
Total Bilirubin: 0.3 mg/dL (ref 0.3–1.2)
Total Protein: 7.9 g/dL (ref 6.0–8.3)

## 2014-05-23 LAB — CBC WITH DIFFERENTIAL/PLATELET
Basophils Absolute: 0 K/uL (ref 0.0–0.1)
Basophils Relative: 0 % (ref 0–1)
Eosinophils Absolute: 0.1 K/uL (ref 0.0–0.7)
Eosinophils Relative: 1 % (ref 0–5)
HCT: 35.9 % — ABNORMAL LOW (ref 39.0–52.0)
Hemoglobin: 11.3 g/dL — ABNORMAL LOW (ref 13.0–17.0)
Lymphocytes Relative: 12 % (ref 12–46)
Lymphs Abs: 1.1 K/uL (ref 0.7–4.0)
MCH: 26 pg (ref 26.0–34.0)
MCHC: 31.5 g/dL (ref 30.0–36.0)
MCV: 82.5 fL (ref 78.0–100.0)
Monocytes Absolute: 0.6 K/uL (ref 0.1–1.0)
Monocytes Relative: 6 % (ref 3–12)
Neutro Abs: 7.3 K/uL (ref 1.7–7.7)
Neutrophils Relative %: 81 % — ABNORMAL HIGH (ref 43–77)
Platelets: 296 K/uL (ref 150–400)
RBC: 4.35 MIL/uL (ref 4.22–5.81)
RDW: 13.6 % (ref 11.5–15.5)
WBC: 9 K/uL (ref 4.0–10.5)

## 2014-05-23 LAB — CBG MONITORING, ED
GLUCOSE-CAPILLARY: 100 mg/dL — AB (ref 70–99)
GLUCOSE-CAPILLARY: 38 mg/dL — AB (ref 70–99)
Glucose-Capillary: 127 mg/dL — ABNORMAL HIGH (ref 70–99)
Glucose-Capillary: 174 mg/dL — ABNORMAL HIGH (ref 70–99)

## 2014-05-23 LAB — TROPONIN I: Troponin I: <0.3 ng/mL

## 2014-05-23 MED ORDER — ENOXAPARIN SODIUM 40 MG/0.4ML ~~LOC~~ SOLN
40.0000 mg | SUBCUTANEOUS | Status: DC
  Administered 2014-05-24 – 2014-05-25 (×2): 40 mg via SUBCUTANEOUS
  Filled 2014-05-23 (×2): qty 0.4

## 2014-05-23 MED ORDER — HYDRALAZINE HCL 20 MG/ML IJ SOLN: 10.0000 mg | INTRAMUSCULAR | Status: DC | PRN

## 2014-05-23 MED ORDER — FUROSEMIDE 80 MG PO TABS: 120.0000 mg | ORAL_TABLET | Freq: Two times a day (BID) | ORAL | Status: DC

## 2014-05-23 MED ORDER — HYDROCODONE-ACETAMINOPHEN 5-325 MG PO TABS: 1.0000 | ORAL_TABLET | ORAL | Status: DC | PRN

## 2014-05-23 MED ORDER — ASPIRIN EC 81 MG PO TBEC
81.0000 mg | DELAYED_RELEASE_TABLET | Freq: Every day | ORAL | Status: DC
  Administered 2014-05-24 – 2014-05-25 (×2): 81 mg via ORAL
  Filled 2014-05-23 (×2): qty 1

## 2014-05-23 MED ORDER — ONDANSETRON HCL 4 MG/2ML IJ SOLN
4.0000 mg | Freq: Four times a day (QID) | INTRAMUSCULAR | Status: DC | PRN
  Filled 2014-05-23: qty 2

## 2014-05-23 MED ORDER — ALLOPURINOL 300 MG PO TABS
300.0000 mg | ORAL_TABLET | Freq: Every day | ORAL | Status: DC
  Administered 2014-05-24 – 2014-05-25 (×3): 300 mg via ORAL
  Filled 2014-05-23 (×3): qty 1

## 2014-05-23 MED ORDER — ONDANSETRON HCL 4 MG PO TABS: 4.0000 mg | ORAL_TABLET | Freq: Four times a day (QID) | ORAL | Status: DC | PRN

## 2014-05-23 MED ORDER — SODIUM CHLORIDE 0.9 % IJ SOLN
3.0000 mL | Freq: Two times a day (BID) | INTRAMUSCULAR | Status: DC
  Administered 2014-05-23 – 2014-05-25 (×3): 3 mL via INTRAVENOUS

## 2014-05-23 MED ORDER — DONEPEZIL HCL 10 MG PO TABS
10.0000 mg | ORAL_TABLET | Freq: Every day | ORAL | Status: DC
  Administered 2014-05-24 – 2014-05-25 (×3): 10 mg via ORAL
  Filled 2014-05-23 (×3): qty 1

## 2014-05-23 MED ORDER — BENAZEPRIL HCL 40 MG PO TABS
40.0000 mg | ORAL_TABLET | Freq: Every day | ORAL | Status: DC
  Administered 2014-05-23 – 2014-05-25 (×3): 40 mg via ORAL
  Filled 2014-05-23 (×3): qty 1

## 2014-05-23 MED ORDER — FUROSEMIDE 80 MG PO TABS
120.0000 mg | ORAL_TABLET | Freq: Two times a day (BID) | ORAL | Status: DC
  Administered 2014-05-24 – 2014-05-25 (×3): 120 mg via ORAL
  Filled 2014-05-23 (×5): qty 1

## 2014-05-23 MED ORDER — ACETAMINOPHEN 325 MG PO TABS: 650.0000 mg | ORAL_TABLET | Freq: Four times a day (QID) | ORAL | Status: DC | PRN

## 2014-05-23 MED ORDER — ACETAMINOPHEN 650 MG RE SUPP: 650.0000 mg | Freq: Four times a day (QID) | RECTAL | Status: DC | PRN

## 2014-05-23 MED ORDER — SODIUM CHLORIDE 0.9 % IV BOLUS (SEPSIS): 500.0000 mL | Freq: Once | INTRAVENOUS | Status: DC

## 2014-05-23 NOTE — ED Notes (Signed)
Pt given food

## 2014-05-23 NOTE — ED Notes (Signed)
Spoke with family advised pt family, Pt received D50 with EMS and it brought his CBG to 163,  When he arrived it was 100 Advised I did a neuro assessment and no changes. Pt family states pt feel and needs to be checked out for a fall as well.

## 2014-05-23 NOTE — ED Notes (Signed)
Internal MD at the bedside.

## 2014-05-23 NOTE — ED Notes (Addendum)
CRITICAL VALUE ALERT  Critical value received: glucose 43 Date of notification:  05/23/14  Time of notification:  1952 Critical value read back: Yes
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
## 2014-05-23 NOTE — Progress Notes (Signed)
Received report from [place], Dumont in ED.

## 2014-05-23 NOTE — ED Notes (Signed)
Patient denies pain and is resting comfortably.  

## 2014-05-23 NOTE — ED Notes (Signed)
MD at bedside. 

## 2014-05-23 NOTE — ED Notes (Signed)
Patient transported to X-ray 

## 2014-05-23 NOTE — ED Notes (Signed)
Spoke with MD advised we could wait until after pt eats and reassess CBG before giving the bolus.

## 2014-05-23 NOTE — ED Notes (Signed)
Report attempted, RN to call back in 10 min. 

## 2014-05-23 NOTE — ED Notes (Signed)
CBG on arrival 100. 

## 2014-05-23 NOTE — ED Notes (Signed)
Pt was found unresponsive by daughter EMS states patient BS was 40 Pt give 1 amp oiof d50 and CBG 163. Pt was confused with ems but Alert to self and place on arrival.

## 2014-05-23 NOTE — ED Notes (Signed)
Pt's daughter acting very anxious and upset, states we are no addressing pt's pain and we don't understand that pt has dementia, pt upset with neuro assessment state that the pt is unable to respond any question that he doesn't understand any question that we make to him, that we suppose to ask her. Family member very upset that they can't take care of him at home and they need him to be admitted.

## 2014-05-24 DIAGNOSIS — F0390 Unspecified dementia without behavioral disturbance: Secondary | ICD-10-CM

## 2014-05-24 DIAGNOSIS — F09 Unspecified mental disorder due to known physiological condition: Secondary | ICD-10-CM

## 2014-05-24 DIAGNOSIS — E162 Hypoglycemia, unspecified: Secondary | ICD-10-CM

## 2014-05-24 DIAGNOSIS — T383X4A Poisoning by insulin and oral hypoglycemic [antidiabetic] drugs, undetermined, initial encounter: Secondary | ICD-10-CM | POA: Diagnosis not present

## 2014-05-24 DIAGNOSIS — R413 Other amnesia: Secondary | ICD-10-CM

## 2014-05-24 LAB — COMPREHENSIVE METABOLIC PANEL
ALBUMIN: 3.6 g/dL (ref 3.5–5.2)
ALT: 16 U/L (ref 0–53)
AST: 28 U/L (ref 0–37)
Alkaline Phosphatase: 75 U/L (ref 39–117)
Anion gap: 12 (ref 5–15)
BILIRUBIN TOTAL: 0.4 mg/dL (ref 0.3–1.2)
BUN: 10 mg/dL (ref 6–23)
CALCIUM: 9.6 mg/dL (ref 8.4–10.5)
CO2: 24 mEq/L (ref 19–32)
CREATININE: 0.94 mg/dL (ref 0.50–1.35)
Chloride: 103 mEq/L (ref 96–112)
GFR calc Af Amer: >90 mL/min (ref >90)
GFR calc non Af Amer: 80 mL/min — ABNORMAL LOW (ref >90)
Glucose, Bld: 118 mg/dL — ABNORMAL HIGH (ref 70–99)
Potassium: 4 mEq/L (ref 3.7–5.3)
Sodium: 139 mEq/L (ref 137–147)
Total Protein: 7.2 g/dL (ref 6.0–8.3)

## 2014-05-24 LAB — GLUCOSE, CAPILLARY
GLUCOSE-CAPILLARY: 134 mg/dL — AB (ref 70–99)
GLUCOSE-CAPILLARY: 141 mg/dL — AB (ref 70–99)
GLUCOSE-CAPILLARY: 267 mg/dL — AB (ref 70–99)
GLUCOSE-CAPILLARY: 246 mg/dL — AB (ref 70–99)
Glucose-Capillary: 110 mg/dL — ABNORMAL HIGH (ref 70–99)
Glucose-Capillary: 196 mg/dL — ABNORMAL HIGH (ref 70–99)
Glucose-Capillary: 108 mg/dL — ABNORMAL HIGH (ref 70–99)

## 2014-05-24 LAB — URINALYSIS W MICROSCOPIC (NOT AT ARMC)
Bilirubin Urine: NEGATIVE
GLUCOSE, UA: NEGATIVE mg/dL
KETONES UR: NEGATIVE mg/dL
Nitrite: NEGATIVE
Protein, ur: NEGATIVE mg/dL
Specific Gravity, Urine: 1.008 (ref 1.005–1.030)
UROBILINOGEN UA: 0.2 mg/dL (ref 0.0–1.0)
pH: 6.5 (ref 5.0–8.0)

## 2014-05-24 LAB — TSH: TSH: 1.28 u[IU]/mL (ref 0.350–4.500)

## 2014-05-24 LAB — CBC
HEMATOCRIT: 34 % — AB (ref 39.0–52.0)
Hemoglobin: 10.3 g/dL — ABNORMAL LOW (ref 13.0–17.0)
MCH: 24.9 pg — ABNORMAL LOW (ref 26.0–34.0)
MCHC: 30.3 g/dL (ref 30.0–36.0)
MCV: 82.1 fL (ref 78.0–100.0)
PLATELETS: 264 10*3/uL (ref 150–400)
RBC: 4.14 MIL/uL — ABNORMAL LOW (ref 4.22–5.81)
RDW: 13.7 % (ref 11.5–15.5)
WBC: 6 10*3/uL (ref 4.0–10.5)

## 2014-05-24 LAB — PROTIME-INR
INR: 1.04 (ref 0.00–1.49)
Prothrombin Time: 13.7 seconds (ref 11.6–15.2)

## 2014-05-24 LAB — HEMOGLOBIN A1C
Hgb A1c MFr Bld: 9.9 % — ABNORMAL HIGH (ref <5.7)
Hgb A1c MFr Bld: 9.9 % — ABNORMAL HIGH (ref <5.7)
MEAN PLASMA GLUCOSE: 237 mg/dL — AB (ref <117)
Mean Plasma Glucose: 237 mg/dL — ABNORMAL HIGH (ref <117)

## 2014-05-24 LAB — VITAMIN B12: Vitamin B-12: 570 pg/mL (ref 211–911)

## 2014-05-24 LAB — FOLATE: Folate: >20 ng/mL

## 2014-05-24 MED ORDER — INSULIN ASPART 100 UNIT/ML ~~LOC~~ SOLN: 0.0000 [IU] | Freq: Three times a day (TID) | SUBCUTANEOUS | Status: DC

## 2014-05-24 MED ORDER — INSULIN ASPART 100 UNIT/ML ~~LOC~~ SOLN
0.0000 [IU] | Freq: Three times a day (TID) | SUBCUTANEOUS | Status: DC
  Administered 2014-05-24: 3 [IU] via SUBCUTANEOUS
  Administered 2014-05-24: 8 [IU] via SUBCUTANEOUS

## 2014-05-24 MED ORDER — INSULIN ASPART 100 UNIT/ML ~~LOC~~ SOLN: 0.0000 [IU] | Freq: Every day | SUBCUTANEOUS | Status: DC

## 2014-05-24 MED ORDER — INSULIN ASPART 100 UNIT/ML ~~LOC~~ SOLN: 4.0000 [IU] | Freq: Three times a day (TID) | SUBCUTANEOUS | Status: DC

## 2014-05-24 NOTE — Progress Notes (Signed)
UR Completed.  [phone]  

## 2014-05-24 NOTE — Progress Notes (Signed)
Pt arrived to unit alert and oriented x4. Oriented to room, unit, and staff.  Bed in lowest position and call bell is within reach. Will continue to monitor. 

## 2014-05-24 NOTE — Progress Notes (Signed)
Inpatient Diabetes Program Recommendations  AACE/ADA: New Consensus Statement on Inpatient Glycemic Control (2013)  Target Ranges:  Prepandial:   less than 140 mg/dL      Peak postprandial:   less than 180 mg/dL (1-2 hours)      Critically ill patients:  140 - 180 mg/dL   Reason for Visit:Consult received for DM and education on insulin injections and hypoglycemia   Diabetes mellitus without complication   . Gout   . Hypertension   . Dementia   . Cancer   . Myocardial infarction   . Pneumonia    Outpatient Diabetes medications: 70/30 30 units bid. Pt found after fall but no witnesses to provide information,hypoglycemic, lives alone and has dementia.memory loss Insulin prescribed per Dr Jeanie Cooks (?) No PCP presently.HgbA1C at 9.9% (Could be due to variability of lows then treatment with too much carb/food.    Current orders for Inpatient glycemic control: Moderate correction tidwc and HS scale and 4 units meal coverage novolog tidwc With weight in kg pt weighs 89 kg. Calculating total daily insulin needs would total 36 units max. Calculating insulin needs by weight/kg using 0.4 units/kg  pt would need approximately 36 units total insulin per day- Basal and bolus  Please feel free to call: [phone] Thank you, Rosita Kea, RN, CNS, Diabetes Coordinator [phone]) I think this is a good starting (over) dose of daily basal and bolus needs. This would translate to 18 -20 units  70/30 bid (half of what he is taking at home) Would not use lantus and correction or meal coverage due to the potential for mixing up the bottles of insulin, i.e lantus for novolog and vice-versa. Doubtful that patient is truly checking blood sugars at appropriate times in order to base his correction and mealtime needs. 70/30 is his best option in the am and ac supper, but would adjust to 20 units bid at home.  Will assess pt ed needs tomorrow . Pt would benefit from HH-for dementia-abilities for  self - care at home for a few weeks just to observe how patient checks blood sugars, records and when and how he gives his insulin
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
## 2014-05-24 NOTE — Progress Notes (Signed)
PROGRESS NOTE  Carlos Schultz UXN:[id number] DOB: November 30, 1940 DOA: 05/23/2014 PCP: No PCP Per Patient  Assessment/Plan: 73 yo male with pmh of dementia was seen in the ER after a fall.  He was diagnosed with fracture of his right little finger and discharged.  He was found at home shortly after with a cbg in the 40s.  He was brought back to the ER where he was give Dextrose and his cbg dropped to the 30s.  This morning when asked how much insulin he takes at home, he replies "about a teaspoon".  With further treatment his cbgs have stabilized.  PT recommends SNF for physical therapy rehab (patient hurt his back when he fell).  Daughter wants her father in short term rehab for his safety.  Patient denies that he has a fractured foot and refuses SNF for rehab.    Hypoglycemia Secondary to exogenous insulin over use. CBGs now stable.  Checking A1C Patient is unable to manage his own medications.  Was taking 70/30 bid at home. Need to determine how much insulin he should be on & find a safe discharge with medication management.  Syncope  Secondary to hypoglycemia.  CT head negative.  Frequent Falling PT evaluated and recommended SNF.  Urinary incontinence. No frank UTI.  Check culture.  Capacity PT recommended SNF for strengthening and balance.  MD recommends SNF for medication management. Family wants SNF until they can make arrangements to keep him at home safely. Patient currently refusing SNF.   DVT Prophylaxis:  lovenox  Code Status: full Family Communication: spoke with daughter Ms. Cheek by phone. Disposition Plan: to be determined.  SNF vs home with home health   Consultants:  Psychiatry  Procedures:  none  Antibiotics: Anti-infectives    None      HPI/Subjective: Patient reports he's feeling fine and requesting d/c so that he can go take care of his home.    Objective: Filed Vitals:   05/23/14 2030 05/23/14 2115 05/24/14 0505 05/24/14 0508  BP: 170/61  166/80  150/64  Pulse: 86 83  77  Temp:    99.2 F (37.3 C)  TempSrc:    Oral  Resp: 19 20  17   Height:      Weight:   89.2 kg (196 lb 10.4 oz)   SpO2: 100% 98%  100%    Intake/Output Summary (Last 24 hours) at 05/24/14 1117 Last data filed at 05/24/14 2542  Gross per 24 hour  Intake    270 ml  Output    600 ml  Net   -330 ml   Filed Weights   05/23/14 1622 05/24/14 0505  Weight: 90.719 kg (200 lb) 89.2 kg (196 lb 10.4 oz)    Exam: General: Well developed, well nourished, NAD, appears stated age, pleasant HEENT:   Anicteic Sclera, MMM. No pharyngeal erythema or exudates  Neck: Supple, no JVD, no masses  Cardiovascular: RRR, S1 S2 auscultated, no rubs, murmurs or gallops.   Respiratory: Clear to auscultation bilaterally with equal chest rise  Abdomen: Soft, nontender, nondistended, + bowel sounds  Extremities: warm dry, mild pedal edema noted in RLE Neuro: cranial nerves grossly intact. Strength 5/5 in upper and lower extremities  Skin: Without rashes exudates or nodules.   Psych: Pleasant demeanor.  Doesn't appear to understand the gravity of appropriate insulin management.       Data Reviewed: Basic Metabolic Panel:  Recent Labs Lab 05/22/14 1840 05/23/14 1754 05/24/14 0554  NA 138 142 139  K 4.9 4.8 4.0  CL 103 104 103  CO2 21 23 24   GLUCOSE 106* 43* 118*  BUN 13 12 10   CREATININE 1.25 1.13 0.94  CALCIUM 9.9 10.2 9.6   Liver Function Tests:  Recent Labs Lab 05/22/14 1840 05/23/14 1754 05/24/14 0554  AST 43* 43* 28  ALT 19 21 16   ALKPHOS 83 80 75  BILITOT 0.2* 0.3 0.4  PROT 7.5 7.9 7.2  ALBUMIN 3.8 4.1 3.6   CBC:  Recent Labs Lab 05/22/14 1840 05/23/14 1754 05/24/14 0554  WBC 6.3 9.0 6.0  NEUTROABS 4.5 7.3  --   HGB 10.6* 11.3* 10.3*  HCT 34.4* 35.9* 34.0*  MCV 83.1 82.5 82.1  PLT 270 296 264   Cardiac Enzymes:  Recent Labs Lab 05/23/14 1754  TROPONINI <0.30   BNP (last 3 results) CBG:  Recent Labs Lab 05/23/14 1906  05/23/14 1954 05/24/14 0233 05/24/14 0504 05/24/14 0815  GLUCAP 127* 174* 134* 110* 141*      Studies: Dg Chest 2 View  05/23/2014   CLINICAL DATA:  Found unresponsive, and hypoglycemic. Confusion. Diabetes. Gout. Hypertension. Dementia.  EXAM: CHEST  2 VIEW  COMPARISON:  03/16/2014  FINDINGS: Emphysema. Mild enlargement of the cardiopericardial silhouette, without edema. Faint atherosclerotic calcification in the aortic arch.  Thoracic spondylosis. No pleural effusion or significant airspace opacity. Prominence of stool in the upper abdominal colon.  IMPRESSION: 1. Mild enlargement of the cardiopericardial silhouette, without edema. 2. Emphysema. 3. Prominence of stool in the upper colon, query constipation.   Electronically Signed   By: Sherryl Barters M.D.   On: 05/23/2014 19:16   Dg Lumbar Spine 2-3 Views  05/23/2014   CLINICAL DATA:  Low back pain after fall.  EXAM: LUMBAR SPINE - 2-3 VIEW  COMPARISON:  None.  FINDINGS: Diffuse osteopenia is noted. Degenerative disc disease is noted at L2-3 and L3-4 with anterior osteophyte formation. No significant spondylolisthesis is noted. Mildly depressed superior endplate of L3 and L4 vertebral bodies are noted which most likely are chronic. Moderate degenerative disc disease is noted at L4-5 and L5-S1. Hypertrophy of posterior facet joints is seen at L3-4 and L4-5 secondary to degenerative joint disease.  IMPRESSION: Degenerative changes as described above. Mild superior endplate depression of L3 and L4 vertebral bodies is noted which most likely is chronic, but if patient is symptomatic in this area, CT scan may be performed for further evaluation.   Electronically Signed   By: Sabino Dick M.D.   On: 05/23/2014 22:10   Ct Head Wo Contrast  05/23/2014   CLINICAL DATA:  Dizziness.  Diabetes.  Foot swelling.  EXAM: CT HEAD WITHOUT CONTRAST  TECHNIQUE: Contiguous axial images were obtained from the base of the skull through the vertex without  intravenous contrast.  COMPARISON:  04/26/2006  FINDINGS: Global atrophy. Mild chronic ischemic changes in the periventricular white matter. No mass effect, midline shift, or acute hemorrhage. Mastoid air cells are clear. Mucosal thickening in the ethmoid air cells. 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 Intact cranium.  IMPRESSION: No acute intracranial pathology. Global atrophy and chronic ischemic changes are noted.   Electronically Signed   By: Maryclare Bean M.D.   On: 05/23/2014 18:52   Dg Hand Complete Right  05/22/2014   CLINICAL DATA:  Right hand swelling without injury.  EXAM: RIGHT HAND - COMPLETE 3+ VIEW  COMPARISON:  April 26, 2006.  FINDINGS: There is noted a mildly displaced fracture involving the dorsal aspect of the proximal base  of the fifth distal phalanx. Mild degenerative changes seen involving the fourth metacarpophalangeal and proximal interphalangeal joints.  IMPRESSION: Moderately displaced fracture seen involving the proximal base of the fifth distal phalanx.   Electronically Signed   By: Sabino Dick M.D.   On: 05/22/2014 20:55    Scheduled Meds: . allopurinol  300 mg Oral Daily  . aspirin EC  81 mg Oral Daily  . benazepril  40 mg Oral Daily  . donepezil  10 mg Oral Daily  . enoxaparin (LOVENOX) injection  40 mg Subcutaneous Q24H  . furosemide  120 mg Oral BID  . sodium chloride  500 mL Intravenous Once  . sodium chloride  3 mL Intravenous Q12H   Continuous Infusions:   Principal Problem:   Hypoglycemia Active Problems:   SYNCOPE   Memory loss   URINARY INCONTINENCE, URGE   ADENOCARCINOMA, PROSTATE, HX OF   Pedal edema    Karen Kitchens  [facility] Pager [phone]. If 7PM-7AM, please contact night-coverage at [email or web address], password [facility] 05/24/2014, 11:17 AM  LOS: 1 day    Patient seen and examined. He think he has been in the hospital for 4 days. He was not able to tell me the date. He said weeks before christmas.  He denies any new  complaints.  Psych evaluation for capacity. Patient need supervise environment, need assistance with medications. Patient has been living by himself.  Hypoglycemic episode resolved. Would use SSI, resume 70/30 at discharge depending on insulin requirement.   Elloise Roark, Md.

## 2014-05-24 NOTE — Plan of Care (Signed)
Problem: Phase I Progression Outcomes Goal: OOB as tolerated unless otherwise ordered Outcome: Completed/Met Date Met:  05/24/14 Gets up with assist

## 2014-05-24 NOTE — Progress Notes (Signed)
Nutrition Brief Note  Patient identified on the Malnutrition Screening Tool (MST) Report  Wt Readings from Last 15 Encounters:  05/24/14 196 lb 10.4 oz (89.2 kg)  03/18/14 205 lb 14.4 oz (93.396 kg)  01/04/14 207 lb (93.895 kg)  10/29/12 225 lb (102.059 kg)  02/22/08 232 lb (105.235 kg)  12/22/07 233 lb (105.688 kg)  09/22/07 229 lb 8 oz (104.101 kg)  09/10/07 228 lb (103.42 kg)    Body mass index is 26.66 kg/(m^2). Patient meets criteria for overweight based on current BMI.   Current diet order is carbohydrate modified with low fiber, patient is consuming approximately 100% of meals at this time. Labs and medications reviewed.  Pt reports that his weight has been stable. He has a good appetite. Pt eating lunch during RD visit.   No nutrition interventions warranted at this time. If nutrition issues arise, please consult RD.   Laurette Schimke MS, RD, LDN

## 2014-05-24 NOTE — Consult Note (Signed)
[facility] Consult   Reason for Consult:  Capacity evaluation Referring Physician:  Melton Alar, PA-C URI TURNBOUGH is an 73 y.o. male. Total Time spent with patient: 45 minutes  Assessment: AXIS I:  Dementia AXIS II:  Deferred AXIS III:   Past Medical History  Diagnosis Date  . Diabetes mellitus without complication   . Gout   . Hypertension   . Dementia   . Cancer prostate  . Myocardial infarction   . Pneumonia    AXIS IV:  other psychosocial or environmental problems, problems related to social environment and problems with primary support group AXIS V:  51-60 moderate symptoms  Plan:  Patient does not meet criteria for capacity to make his own medical decisions and living arrangements at this time Refer to the psychiatric social service regarding radical care power of attorney to his children if needed Patient does not meet criteria for acute psychiatric hospitalization Recommended physical rehabilitation as recommended by primary team Supportive therapy provided about ongoing stressors. Appreciate psychiatric consultation Please contact [phone] or [phone] if needs further assistance   Subjective:   BLANCHARD WILLHITE is a 73 y.o. male patient admitted with hypoglycemia and confusion.  HPI:  TAIVEN GREENLEY is a 73 y.o. male seen for psychiatric consultation and evaluation of dementia and capacity evaluation to make his medical decisions and living arrangements. Patient reported he fell down and has injury on his left knee. Patient does not have awareness of hypoglycemic  episode at home and feels that he has been in hospital for more than 3 days. Patient reported he forgets like most of the people. Patient feels he is fine physically and does not understand recent episodes of falls and hypoglycemia. Patient family is concerned about his safety. Patient does not have symptoms of depression, anxiety, psychosis, suicidal or homicidal ideation, intention or  plans. Patient has no previous history of psychiatric outpatient or inpatient treatment. Reportedly patient has been in contact with his son and daughter who may be his medical care power of attorney. Patient's sister visiting him stated that she has a good communication and contact with him and have a trusting relationship. Patient agrees to participate in short-term rehabilitation treatment as recommended.   Medical history: Patient with Past medical history of diabetes, hypertension, coronary artery disease, gout, dementia. The patient presents with complaints of unwitnessed fall. Patient has history of diabetes mellitus along with dementia and he lives alone and does his medications on his own. Yesterday he was seen in the ER after a fall at which time he mentions he does not remember how he fell but he denied any passing out episode. Workup showed he had metacarpal fracture and he was sent home after splint. At that point patient was found to have hypoglycemic episode. Today again the patient was found unresponsive at home by the daughter and EMS was called and found the patient's blood sugar were in 40s and after receiving glucose edition sugar improved but dropped again while he was here. After eating something patient's blood sugar for there remained stable and at the time of my evaluation he did not have any acute complaint. He complained of chronic low back pain. The daughter is concerned about patient's dementia and inability to take care of himself at home alone. Patient is following up with Dr. Myrtie Hawk, and now wants to be switched to [facility] family practice.The patient is coming from home. And at his baseline independent for most  of his ADL.  Review of Systems: as mentioned in the history of present illness.  A Comprehensive review of the other systems is negative.  Past Psychiatric History: Past Medical History  Diagnosis Date  . Diabetes mellitus without complication    . Gout   . Hypertension   . Dementia   . Cancer prostate  . Myocardial infarction   . Pneumonia     reports that he has quit smoking. He has quit using smokeless tobacco. He reports that he does not drink alcohol or use illicit drugs. History reviewed. No pertinent family history.   Living Arrangements: Alone   Abuse/Neglect [facility] 121) Physical Abuse: Denies Verbal Abuse: Denies Sexual Abuse: Denies Allergies:  No Known Allergies  ACT Assessment Complete:  NO Objective: Blood pressure 150/64, pulse 77, temperature 99.2 F (37.3 C), temperature source Oral, resp. rate 17, height 6' (1.829 m), weight 89.2 kg (196 lb 10.4 oz), SpO2 100 %.Body mass index is 26.66 kg/(m^2). Results for orders placed or performed during the hospital encounter of 05/23/14 (from the past 72 hour(s))  CBG monitoring, ED     Status: Abnormal   Collection Time: 05/23/14  4:12 PM  Result Value Ref Range   Glucose-Capillary 100 (H) 70 - 99 mg/dL  CBC with Differential     Status: Abnormal   Collection Time: 05/23/14  5:54 PM  Result Value Ref Range   WBC 9.0 4.0 - 10.5 K/uL   RBC 4.35 4.22 - 5.81 MIL/uL   Hemoglobin 11.3 (L) 13.0 - 17.0 g/dL   HCT 35.9 (L) 39.0 - 52.0 %   MCV 82.5 78.0 - 100.0 fL   MCH 26.0 26.0 - 34.0 pg   MCHC 31.5 30.0 - 36.0 g/dL   RDW 13.6 11.5 - 15.5 %   Platelets 296 150 - 400 K/uL   Neutrophils Relative % 81 (H) 43 - 77 %   Neutro Abs 7.3 1.7 - 7.7 K/uL   Lymphocytes Relative 12 12 - 46 %   Lymphs Abs 1.1 0.7 - 4.0 K/uL   Monocytes Relative 6 3 - 12 %   Monocytes Absolute 0.6 0.1 - 1.0 K/uL   Eosinophils Relative 1 0 - 5 %   Eosinophils Absolute 0.1 0.0 - 0.7 K/uL   Basophils Relative 0 0 - 1 %   Basophils Absolute 0.0 0.0 - 0.1 K/uL  Comprehensive metabolic panel     Status: Abnormal   Collection Time: 05/23/14  5:54 PM  Result Value Ref Range   Sodium 142 137 - 147 mEq/L   Potassium 4.8 3.7 - 5.3 mEq/L   Chloride 104 96 - 112 mEq/L   CO2 23 19 - 32 mEq/L   Glucose,  Bld 43 (LL) 70 - 99 mg/dL    Comment: REPEATED TO VERIFY CRITICAL RESULT CALLED TO, READ BACK BY AND VERIFIED WITH: Althea Charon [id number] 05/23/14 WBOND    BUN 12 6 - 23 mg/dL   Creatinine, Ser 1.13 0.50 - 1.35 mg/dL   Calcium 10.2 8.4 - 10.5 mg/dL   Total Protein 7.9 6.0 - 8.3 g/dL   Albumin 4.1 3.5 - 5.2 g/dL   AST 43 (H) 0 - 37 U/L   ALT 21 0 - 53 U/L   Alkaline Phosphatase 80 39 - 117 U/L   Total Bilirubin 0.3 0.3 - 1.2 mg/dL   GFR calc non Af Amer 62 (L) >90 mL/min   GFR calc Af Amer 72 (L) >90 mL/min    Comment: (NOTE) The eGFR has been  calculated using the CKD EPI equation. This calculation has not been validated in all clinical situations. eGFR's persistently <90 mL/min signify possible Chronic Kidney Disease.    Anion gap 15 5 - 15  Troponin I     Status: None   Collection Time: 05/23/14  5:54 PM  Result Value Ref Range   Troponin I <0.30 <0.30 ng/mL    Comment:        Due to the release kinetics of cTnI, a negative result within the first hours of the onset of symptoms does not rule out myocardial infarction with certainty. If myocardial infarction is still suspected, repeat the test at appropriate intervals.   CBG monitoring, ED     Status: Abnormal   Collection Time: 05/23/14  5:57 PM  Result Value Ref Range   Glucose-Capillary 38 (LL) 70 - 99 mg/dL   Comment 1 Notify RN   Urinalysis, Routine w reflex microscopic     Status: Abnormal   Collection Time: 05/23/14  7:06 PM  Result Value Ref Range   Color, Urine YELLOW YELLOW   APPearance CLOUDY (A) CLEAR   Specific Gravity, Urine 1.009 1.005 - 1.030   pH 7.0 5.0 - 8.0   Glucose, UA NEGATIVE NEGATIVE mg/dL   Hgb urine dipstick SMALL (A) NEGATIVE   Bilirubin Urine NEGATIVE NEGATIVE   Ketones, ur NEGATIVE NEGATIVE mg/dL   Protein, ur NEGATIVE NEGATIVE mg/dL   Urobilinogen, UA 0.2 0.0 - 1.0 mg/dL   Nitrite NEGATIVE NEGATIVE   Leukocytes, UA TRACE (A) NEGATIVE  Urine microscopic-add on     Status: Abnormal    Collection Time: 05/23/14  7:06 PM  Result Value Ref Range   Squamous Epithelial / LPF RARE RARE   WBC, UA 3-6 <3 WBC/hpf   RBC / HPF 3-6 <3 RBC/hpf   Bacteria, UA FEW (A) RARE  CBG monitoring, ED     Status: Abnormal   Collection Time: 05/23/14  7:06 PM  Result Value Ref Range   Glucose-Capillary 127 (H) 70 - 99 mg/dL  CBG monitoring, ED     Status: Abnormal   Collection Time: 05/23/14  7:54 PM  Result Value Ref Range   Glucose-Capillary 174 (H) 70 - 99 mg/dL  Vitamin B12     Status: None   Collection Time: 05/23/14 11:27 PM  Result Value Ref Range   Vitamin B-12 570 211 - 911 pg/mL    Comment: Performed at [facility]  Folate     Status: None   Collection Time: 05/23/14 11:27 PM  Result Value Ref Range   Folate >20.0 ng/mL    Comment: (NOTE) Reference Ranges        Deficient:       0.4 - 3.3 ng/mL        Indeterminate:   3.4 - 5.4 ng/mL        Normal:              > 5.4 ng/mL Performed at [facility]   TSH     Status: None   Collection Time: 05/23/14 11:27 PM  Result Value Ref Range   TSH 1.280 0.350 - 4.500 uIU/mL  Glucose, capillary     Status: Abnormal   Collection Time: 05/24/14  2:33 AM  Result Value Ref Range   Glucose-Capillary 134 (H) 70 - 99 mg/dL   Comment 1 Notify RN   Glucose, capillary     Status: Abnormal   Collection Time: 05/24/14  5:04 AM  Result Value Ref  Range   Glucose-Capillary 110 (H) 70 - 99 mg/dL   Comment 1 Notify RN   Comprehensive metabolic panel     Status: Abnormal   Collection Time: 05/24/14  5:54 AM  Result Value Ref Range   Sodium 139 137 - 147 mEq/L   Potassium 4.0 3.7 - 5.3 mEq/L   Chloride 103 96 - 112 mEq/L   CO2 24 19 - 32 mEq/L   Glucose, Bld 118 (H) 70 - 99 mg/dL   BUN 10 6 - 23 mg/dL   Creatinine, Ser 9.37 0.50 - 1.35 mg/dL   Calcium 9.6 8.4 - 90.2 mg/dL   Total Protein 7.2 6.0 - 8.3 g/dL   Albumin 3.6 3.5 - 5.2 g/dL   AST 28 0 - 37 U/L   ALT 16 0 - 53 U/L   Alkaline Phosphatase 75 39 - 117 U/L    Total Bilirubin 0.4 0.3 - 1.2 mg/dL   GFR calc non Af Amer 80 (L) >90 mL/min   GFR calc Af Amer >90 >90 mL/min    Comment: (NOTE) The eGFR has been calculated using the CKD EPI equation. This calculation has not been validated in all clinical situations. eGFR's persistently <90 mL/min signify possible Chronic Kidney Disease.    Anion gap 12 5 - 15  CBC     Status: Abnormal   Collection Time: 05/24/14  5:54 AM  Result Value Ref Range   WBC 6.0 4.0 - 10.5 K/uL   RBC 4.14 (L) 4.22 - 5.81 MIL/uL   Hemoglobin 10.3 (L) 13.0 - 17.0 g/dL   HCT 40.9 (L) 73.5 - 32.9 %   MCV 82.1 78.0 - 100.0 fL   MCH 24.9 (L) 26.0 - 34.0 pg   MCHC 30.3 30.0 - 36.0 g/dL   RDW 92.4 26.8 - 34.1 %   Platelets 264 150 - 400 K/uL  Protime-INR     Status: None   Collection Time: 05/24/14  5:54 AM  Result Value Ref Range   Prothrombin Time 13.7 11.6 - 15.2 seconds   INR 1.04 0.00 - 1.49  Glucose, capillary     Status: Abnormal   Collection Time: 05/24/14  8:15 AM  Result Value Ref Range   Glucose-Capillary 141 (H) 70 - 99 mg/dL   Labs are reviewed and are pertinent for abnl glucose.  Current Facility-Administered Medications  Medication Dose Route Frequency Provider Last Rate Last Dose  . acetaminophen (TYLENOL) tablet 650 mg  650 mg Oral Q6H PRN Lynden Oxford, MD       Or  . acetaminophen (TYLENOL) suppository 650 mg  650 mg Rectal Q6H PRN Lynden Oxford, MD      . allopurinol (ZYLOPRIM) tablet 300 mg  300 mg Oral Daily Lynden Oxford, MD   300 mg at 05/24/14 1058  . aspirin EC tablet 81 mg  81 mg Oral Daily Lynden Oxford, MD   81 mg at 05/24/14 1058  . benazepril (LOTENSIN) tablet 40 mg  40 mg Oral Daily Lynden Oxford, MD   40 mg at 05/24/14 1058  . donepezil (ARICEPT) tablet 10 mg  10 mg Oral Daily Lynden Oxford, MD   10 mg at 05/24/14 1058  . enoxaparin (LOVENOX) injection 40 mg  40 mg Subcutaneous Q24H Lynden Oxford, MD   40 mg at 05/24/14 1058  . furosemide (LASIX) tablet 120 mg  120 mg Oral BID Lynden Oxford,  MD   120 mg at 05/24/14 0813  . hydrALAZINE (APRESOLINE) injection 10 mg  10 mg Intravenous Q4H  PRN Berle Mull, MD      . HYDROcodone-acetaminophen (NORCO/VICODIN) 5-325 MG per tablet 1-2 tablet  1-2 tablet Oral Q4H PRN Berle Mull, MD      . ondansetron [facility]) tablet 4 mg  4 mg Oral Q6H PRN Berle Mull, MD       Or  . ondansetron (ZOFRAN) injection 4 mg  4 mg Intravenous Q6H PRN Berle Mull, MD      . sodium chloride 0.9 % bolus 500 mL  500 mL Intravenous Once Pamella Pert, MD   Stopped at 05/23/14 2020  . sodium chloride 0.9 % injection 3 mL  3 mL Intravenous Q12H Berle Mull, MD   3 mL at 05/24/14 1059    Psychiatric Specialty Exam: Physical Exam as per history and physical   ROS forgetful and somewhat irritable   Blood pressure 150/64, pulse 77, temperature 99.2 F (37.3 C), temperature source Oral, resp. rate 17, height 6' (1.829 m), weight 89.2 kg (196 lb 10.4 oz), SpO2 100 %.Body mass index is 26.66 kg/(m^2).  General Appearance: Guarded  Eye Contact::  Good  Speech:  Clear and Coherent  Volume:  Normal  Mood:  Anxious and Irritable  Affect:  Appropriate and Congruent  Thought Process:  Coherent and Goal Directed  Orientation:  Full (Time, Place, and Person)  Thought Content:  Rumination  Suicidal Thoughts:  No  Homicidal Thoughts:  No  Memory:  Immediate;   Fair Recent;   Poor  Judgement:  Fair  Insight:  Shallow  Psychomotor Activity:  Decreased  Concentration:  Fair  Recall:  Poor  Fund of Knowledge:Fair  Language: Good  Akathisia:  NA  Handed:  Right  AIMS (if indicated):     Assets:  Communication Skills Desire for Improvement Financial Resources/Insurance Housing Intimacy Leisure Time Resilience Social Support Talents/Skills  Sleep:      Musculoskeletal: Strength & Muscle Tone: decreased Gait & Station: unable to stand Patient leans: N/A  Treatment Plan Summary: Daily contact with patient to assess and evaluate symptoms and progress in  treatment Medication management  Zaira Iacovelli,JANARDHAHA R. 05/24/2014 11:05 AM

## 2014-05-24 NOTE — Evaluation (Signed)
Physical Therapy Evaluation Patient Details Name: Carlos Schultz MRN: [id number] DOB: [birth year] Today's Date: 05/24/2014   History of Present Illness  Pt is a 73 y.o. male with PMH of diabetes, HTN, CAD, gout, dementia. The patient presents with complaints of unwitnessed fall. Patient has history of diabetes mellitus along with dementia and he lives alone and does his medications on his own. Yesterday he was seen in the ER after a fall at which time he mentions he does not remember how he fell but he denied any passing out episode. Workup showed he had metacarpal fracture and he was sent home after splint. At that point patient was found to have hypoglycemic episode. Day of admission again the patient was found unresponsive at home by the daughter and EMS was called and found the patient's blood sugar were in 40s. The daughter is concerned about patient's dementia and inability to take care of himself at home alone.    Clinical Impression  Pt admitted with above diagnosis. Pt currently with functional limitations due to the deficits listed below (see PT Problem List). At the time of PT eval pt was able to demonstrate transfers with supervision and ambulation with min guard. Pt impulsive at times with decreased awareness of surroundings which poses an increased risk for falls. Pt was educated on need for 24 hour assist at this time for safety. Pt reports that family is able to provide 24 hour support, however no family present to confirm this. Pt refusing SNF at this time, however seemed more agreeable to the idea of assisted living. Discussed the potential burden of care on his family if they are unable to provide the level of care that he currently needs. Encouraged pt to have an open discussion with family regarding this, and the possibility of transitioning to an assisted living setting.    Follow Up Recommendations [place] with Home health PT;Supervision/Assistance - 24 hour     Equipment Recommendations  Cane    Recommendations for Other Services       Precautions / Restrictions Precautions Precautions: Fall Precaution Comments: 2 recent falls PTA due to hypoglycemia Restrictions Weight Bearing Restrictions: No      Mobility  Bed Mobility               General bed mobility comments: Pt sitting on EOB when PT arrived.   Transfers Overall transfer level: Needs assistance Equipment used: None Transfers: Sit to/from Stand Sit to Stand: Supervision         General transfer comment: Pt able to power-up to full standing with no physical assistance. Supervision for safety as pt impulsive and does not appear to be completely aware of his surroundings/objects in his way that pose a fall risk.  Ambulation/Gait Ambulation/Gait assistance: Min guard Ambulation Distance (Feet): 200 Feet Assistive device: 1 person hand held assist Gait Pattern/deviations: Step-through pattern;Decreased stride length;Trunk flexed Gait velocity: Decreased Gait velocity interpretation: Below normal speed for age/gender General Gait Details: Pt appears to have difficulty clearing floor - even when cued to pick up feet more, was unable to make corrective changes. When cued to increase gait speed, pt again unable to make corrective changes.   Stairs            Wheelchair Mobility    Modified Rankin (Stroke Patients Only)       Balance Overall balance assessment: Needs assistance Sitting-balance support: Feet supported;No upper extremity supported Sitting balance-Leahy Scale: Good     Standing balance support: No  upper extremity supported Standing balance-Leahy Scale: Fair                               Pertinent Vitals/Pain Pain Assessment: No/denies pain    Home Living Family/patient expects to be discharged to:: Private residence Living Arrangements: Alone Available Help at Discharge: Family;Available PRN/intermittently Type of Home:  Apartment Home Access: Level entry     Home Layout: One level Home Equipment: Shower seat      Prior Function Level of Independence: Independent         Comments: Does not drive right now. Son takes pt to the grocery store     Hand Dominance   Dominant Hand: Right    Extremity/Trunk Assessment   Upper Extremity Assessment: Defer to OT evaluation           Lower Extremity Assessment: Generalized weakness      Cervical / Trunk Assessment: Normal  Communication   Communication: No difficulties  Cognition Arousal/Alertness: Awake/alert Behavior During Therapy: WFL for tasks assessed/performed Overall Cognitive Status: History of cognitive impairments - at baseline                      General Comments      Exercises        Assessment/Plan    PT Assessment Patient needs continued PT services  PT Diagnosis Difficulty walking;Generalized weakness   PT Problem List Decreased strength;Decreased range of motion;Decreased activity tolerance;Decreased balance;Decreased mobility;Decreased knowledge of use of DME;Decreased safety awareness;Decreased knowledge of precautions  PT Treatment Interventions DME instruction;Gait training;Stair training;Functional mobility training;Therapeutic activities;Therapeutic exercise;Neuromuscular re-education;Patient/family education   PT Goals (Current goals can be found in the Care Plan section) Acute Rehab PT Goals Patient Stated Goal: Return home PT Goal Formulation: With patient Time For Goal Achievement: 05/31/14 Potential to Achieve Goals: Good    Frequency Min 3X/week   Barriers to discharge Decreased caregiver support Pt lives alone. States that his family will be able to provide 24 hour support. Family not available to confirm this. Discussed potential burden of care and encouraged pt to discuss with family.     Co-evaluation               End of Session Equipment Utilized During Treatment: Gait  belt Activity Tolerance: Patient tolerated treatment well Patient left: in chair;with call bell/phone within reach Nurse Communication: Mobility status         Time: 9562-1308 PT Time Calculation (min) (ACUTE ONLY): 22 min   Charges:   PT Evaluation $Initial PT Evaluation Tier I: 1 Procedure PT Treatments $Gait Training: 8-22 mins   PT G Codes:          Rolinda Roan 05/24/2014, 1:10 PM   Rolinda Roan, PT, DPT Acute Rehabilitation Services Pager: [phone]

## 2014-05-25 DIAGNOSIS — S62609A Fracture of unspecified phalanx of unspecified finger, initial encounter for closed fracture: Secondary | ICD-10-CM | POA: Diagnosis present

## 2014-05-25 DIAGNOSIS — T383X4A Poisoning by insulin and oral hypoglycemic [antidiabetic] drugs, undetermined, initial encounter: Secondary | ICD-10-CM | POA: Diagnosis not present

## 2014-05-25 DIAGNOSIS — N390 Urinary tract infection, site not specified: Secondary | ICD-10-CM | POA: Diagnosis present

## 2014-05-25 LAB — GLUCOSE, CAPILLARY
GLUCOSE-CAPILLARY: 117 mg/dL — AB (ref 70–99)
Glucose-Capillary: 192 mg/dL — ABNORMAL HIGH (ref 70–99)
Glucose-Capillary: 182 mg/dL — ABNORMAL HIGH (ref 70–99)
Glucose-Capillary: 130 mg/dL — ABNORMAL HIGH (ref 70–99)

## 2014-05-25 MED ORDER — CEPHALEXIN 500 MG PO CAPS: 500.0000 mg | ORAL_CAPSULE | Freq: Four times a day (QID) | ORAL | Status: DC

## 2014-05-25 MED ORDER — INSULIN ASPART PROT & ASPART (70-30 MIX) 100 UNIT/ML ~~LOC~~ SUSP: 5.0000 [IU] | Freq: Two times a day (BID) | SUBCUTANEOUS | Status: DC

## 2014-05-25 MED ORDER — ACETAMINOPHEN 325 MG PO TABS: 650.0000 mg | ORAL_TABLET | Freq: Four times a day (QID) | ORAL | Status: DC | PRN

## 2014-05-25 MED ORDER — INSULIN ASPART PROT & ASPART (70-30 MIX) 100 UNIT/ML ~~LOC~~ SUSP
5.0000 [IU] | Freq: Two times a day (BID) | SUBCUTANEOUS | Status: DC
  Filled 2014-05-25: qty 10

## 2014-05-25 MED ORDER — INSULIN ASPART PROT & ASPART (70-30 MIX) 100 UNIT/ML ~~LOC~~ SUSP
18.0000 [IU] | Freq: Two times a day (BID) | SUBCUTANEOUS | Status: DC
  Administered 2014-05-25: 18 [IU] via SUBCUTANEOUS
  Filled 2014-05-25: qty 10

## 2014-05-25 MED ORDER — CEPHALEXIN 500 MG PO CAPS
500.0000 mg | ORAL_CAPSULE | Freq: Four times a day (QID) | ORAL | Status: DC
  Administered 2014-05-25: 500 mg via ORAL
  Filled 2014-05-25 (×4): qty 1

## 2014-05-25 MED ORDER — INSULIN ASPART PROT & ASPART (70-30 MIX) 100 UNIT/ML ~~LOC~~ SUSP: 18.0000 [IU] | Freq: Two times a day (BID) | SUBCUTANEOUS | Status: DC

## 2014-05-25 NOTE — Progress Notes (Signed)
Orthopedic Tech Progress Note Patient Details:  Carlos Schultz [birth year] [id number]  Ortho Devices Type of Ortho Device: Finger splint Ortho Device/Splint Location: rue Ortho Device/Splint Interventions: Application   [name] 05/25/2014, 12:39 PM

## 2014-05-25 NOTE — Clinical Social Work Note (Signed)
Patient's daughter complained about wait for EMS. CSW addressed situation appropriately and professionally with daughter. Daughter is having her aunt pick her up to take her to the facility and will meet the patient there. CSW signing off at this time.   Liz Beach MSW, [place], [place], [phone]

## 2014-05-25 NOTE — Progress Notes (Signed)
Physical Therapy Addendum to Add G-Codes    05-29-2014 0937  PT G-Codes **NOT FOR INPATIENT CLASS**  Functional Assessment Tool Used Clinical judgement  Functional Limitation Mobility: Walking and moving around  Mobility: Walking and Moving Around Current Status [phone]) CJ  Mobility: Walking and Moving Around Goal Status [phone]) CJ   Rolinda Roan, PT, DPT Acute Rehabilitation Services Pager: [phone]

## 2014-05-25 NOTE — Progress Notes (Signed)
Patient was discharged to nursing home [facility]) by MD order; discharged instructions  review and sent to facility with care notes; IV DIC; skin intact; splint on right little finger; patient will transported to facility via EMS. Facility was called and report was given to the nurse.

## 2014-05-25 NOTE — Clinical Social Work Placement (Signed)
Clinical Social Work Department CLINICAL SOCIAL WORK PLACEMENT NOTE 05/25/2014  Patient:  Carlos Schultz, Carlos Schultz  Account Number:  [id number] [place] date:  05/23/2014  Clinical Social Worker:  Lovey Newcomer  Date/time:  05/25/2014 11:12 AM  Clinical Social Work is seeking post-discharge placement for this patient at the following level of care:   SKILLED NURSING   (*CSW will update this form in Epic as items are completed)   05/25/2014  Patient/family provided with [facility] Department of Clinical Social Work's list of facilities offering this level of care within the geographic area requested by the patient (or if unable, by the patient's family).  05/25/2014  Patient/family informed of their freedom to choose among providers that offer the needed level of care, that participate in Medicare, Medicaid or managed care program needed by the patient, have an available bed and are willing to accept the patient.  05/25/2014  Patient/family informed of [facility]' ownership interest in [facility], as well as of the fact that they are under no obligation to receive care at this facility.  PASARR submitted to EDS on 05/25/2014 PASARR number received on 05/25/2014  FL2 transmitted to all facilities in geographic area requested by pt/family on  05/25/2014 FL2 transmitted to all facilities within larger geographic area on   Patient informed that his/her managed care company has contracts with or will negotiate with  certain facilities, including the following:     Patient/family informed of bed offers received:  05/25/2014 Patient chooses bed at [facility] Physician recommends and patient chooses bed at    Patient to be transferred to [facility] on  05/25/2014 Patient to be transferred to facility by Ambulance Patient and family notified of transfer on 05/25/2014 Name of family member notified:  Tamela Oddi  The following  physician request were entered in Epic:   Additional Comments:   Per MD patient ready for DC to [facility], patient, patient's family, and facility notified of DC. RN given number for report. DC packet on chart. [facility] transport requested for patient (service request ID: [id number]). CSW signing off.    Liz Beach MSW, [place], [place], [phone]

## 2014-05-25 NOTE — Clinical Social Work Placement (Signed)
Clinical Social Work Department CLINICAL SOCIAL WORK PLACEMENT NOTE 05/25/2014  Patient:  Carlos Schultz, Carlos Schultz  Account Number:  [id number] [place] date:  05/23/2014  Clinical Social Worker:  Lovey Newcomer  Date/time:  05/25/2014 11:12 AM  Clinical Social Work is seeking post-discharge placement for this patient at the following level of care:   SKILLED NURSING   (*CSW will update this form in Epic as items are completed)   05/25/2014  Patient/family provided with [facility] Department of Clinical Social Work's list of facilities offering this level of care within the geographic area requested by the patient (or if unable, by the patient's family).  05/25/2014  Patient/family informed of their freedom to choose among providers that offer the needed level of care, that participate in Medicare, Medicaid or managed care program needed by the patient, have an available bed and are willing to accept the patient.  05/25/2014  Patient/family informed of [facility]' ownership interest in [facility], as well as of the fact that they are under no obligation to receive care at this facility.  PASARR submitted to EDS on 05/25/2014 PASARR number received on 05/25/2014  FL2 transmitted to all facilities in geographic area requested by pt/family on  05/25/2014 FL2 transmitted to all facilities within larger geographic area on   Patient informed that his/her managed care company has contracts with or will negotiate with  certain facilities, including the following:     Patient/family informed of bed offers received:   Patient chooses bed at  Physician recommends and patient chooses bed at    Patient to be transferred to  on   Patient to be transferred to facility by  Patient and family notified of transfer on  Name of family member notified:    The following physician request were entered in Epic:   Additional Comments:    Liz Beach MSW, [place], [place],  [phone]

## 2014-05-25 NOTE — Clinical Social Work Psychosocial (Signed)
Clinical Social Work Department BRIEF PSYCHOSOCIAL ASSESSMENT 05/25/2014  Patient:  Carlos Schultz, Carlos Schultz     Account Number:  [id number]     Admit date:  05/23/2014  Clinical Social Worker:  Lovey Newcomer  Date/Time:  05/25/2014 11:05 AM  Referred by:  Physician  Date Referred:  05/25/2014 Referred for  SNF Placement   Other Referral:   NA   Interview type:  Family Other interview type:   Patient does not have capacity. CSW has completed assessment with daughter Carlos Schultz by phone.    PSYCHOSOCIAL DATA Living Status:  ALONE Admitted from facility:   Level of care:   Primary support name:  Carlos Schultz Primary support relationship to patient:  CHILD, ADULT Degree of support available:   Support is strong.    CURRENT CONCERNS Current Concerns  Post-Acute Placement   Other Concerns:   NA    SOCIAL WORK ASSESSMENT / PLAN CSW spoke with patient's daughter by phone to complete assessment. Daughter Carlos Schultz states that she is not comfortable letting the patient DC to home alone and would like for the patient to DC to SNF when medically stable. Psychiatry has deemed patient to not have the capacity to make his medical decisions. CSW explained SNF search/placement process and answered daughter's questions. Daughter sounded calm and engaged in assessment. CSW will follow up with bed offers.   Assessment/plan status:  Psychosocial Support/Ongoing Assessment of Needs Other assessment/ plan:   Complete Fl2, Fax, PASRR   Information/referral to community resources:   CSW contact information and SNF list given.    PATIENT'S/FAMILY'S RESPONSE TO PLAN OF CARE: Patient's daughter plans for the patient to DC to SNF when ready. CSW will assist.       Liz Beach MSW, [place], [place], [phone]

## 2014-05-25 NOTE — Discharge Summary (Signed)
Physician Discharge Summary  Carlos Schultz WVP:[id number] DOB: [birth year] DOA: 05/23/2014  PCP: No PCP Per Patient  Admit date: 05/23/2014 Discharge date: 05/25/2014  Time spent: 45 minutes  Recommendations for Outpatient Follow-up:  1. Monitor CBGs closely and titrate insulin as appropriate 2. D/C to SNF for PT / OT therapy. 3. Follow urine culture.  Patient UTI - Sensitivities not available on culture at time of d/c 4.  Patient with moderately displaced fracture of proximal 5th digit of right hand.  Needs splint and Surgery follow up with Dr. Burney Gauze in the next 7 days. 5.  Patient with anemia.  Stable for outpatient work up. 6.need B-met to follow renal function, patient on high dose lasix.  7.Follow urine culture results.   Discharge Diagnoses:  Principal Problem:   Hypoglycemia Active Problems:   SYNCOPE   Memory loss   URINARY INCONTINENCE, URGE   ADENOCARCINOMA, PROSTATE, HX OF   Pedal edema   Cognitive disorder   Closed fracture of phalanx or phalanges of hand   UTI (urinary tract infection)   Discharge Condition: stable  Diet recommendation: carb modified.  Filed Weights   05/23/14 1622 05/24/14 0505 05/25/14 5462  Weight: 90.719 kg (200 lb) 89.2 kg (196 lb 10.4 oz) 88 kg (194 lb 0.1 oz)    History of present illness:  Very pleasant 73 yo male who lived at home alone and has a pmh of DM and dementia, fell and was brought into the ER on 12/13.  He had a fractured finger, was treated and discharged.  Later the same day he was found down at home and unresponsive.  He was noted to be hypoglycemic with a cbg in the 40s.  Hospital Course:   Hypoglycemia Secondary to exogenous insulin over use.  CBGs now stable.  A1C is 9.9 Patient is unable to manage his own medications. Was taking 70/30 bid at home.  Dose has been reduced. Please continue to titrate insulin appropriately as an outpatient.  Syncope  Secondary to hypoglycemia. CT head negative.  Frequent  Falling PT evaluated and recommended SNF.  Moderately displaced fracture of the right 5th phalanx Noted on admission xray.  Finger splint placed by ortho tech.   Dr. Bertis Ruddy office is aware.  He will follow up post discharge.  Urinary incontinence. No frank UTI on admission U/A, but repeat U/A showed WBC to numerous to count.  Urine culture is still pending at the time of discharge.  Will treat with 3 days of keflex and request SNF MD to follow the urine culture results and sensitivities.  Capacity PT recommended SNF for strengthening and balance.  MD recommends SNF for medication management. Family wants SNF until they can make arrangements to keep him at home safely. Patient initially refused SNF.  Psychiatry was consulted and determined that the patient does not have the capacity to make his own medical decisions.  The patient ultimately agreed to go to SNF.  Social work meet with his daughter, Ms. Cheek, to assist her with obtaining medical power of attorney for her father.   Procedures: none Consultations: Psychiatry.  Discharge Exam:   Filed Vitals:   05/24/14 0508 05/24/14 1322 05/24/14 2048 05/25/14 0627  BP: 150/64 116/56 141/85 130/68  Pulse: 77 80 86 82  Temp: 99.2 F (37.3 C) 98.4 F (36.9 C) 97.7 F (36.5 C) 98.8 F (37.1 C)  TempSrc: Oral Oral Oral Oral  Resp: $Remo'17 20 20 20  'rtSCA$ Height:      Weight:  88 kg (194 lb 0.1 oz)  SpO2: 100% 100% 100% 96%   General: Well developed, well nourished, NAD, appears stated age, pleasant sitting in chair. HEENT: Anicteic Sclera, MMM. No pharyngeal erythema or exudates  Neck: Supple, no JVD, no masses  Cardiovascular: RRR, S1 S2 auscultated, no rubs, murmurs or gallops.  Respiratory: Clear to auscultation bilaterally with equal chest rise  Abdomen: Soft, nontender, nondistended, + bowel sounds  Extremities: warm dry, mild pedal edema noted in RLE Neuro: cranial nerves grossly intact. Strength 5/5 in upper and  lower extremities  Skin: Without rashes exudates or nodules.   Discharge Instructions  Discharge Instructions    Diet Carb Modified    Complete by:  As directed      Diet Carb Modified    Complete by:  As directed      Increase activity slowly    Complete by:  As directed           Current Discharge Medication List    START taking these medications   Details  acetaminophen (TYLENOL) 325 MG tablet Take 2 tablets (650 mg total) by mouth every 6 (six) hours as needed for mild pain (or Fever >/= 101).    cephALEXin (KEFLEX) 500 MG capsule Take 1 capsule (500 mg total) by mouth every 6 (six) hours. Qty: 12 capsule, Refills: 0      CONTINUE these medications which have CHANGED   Details  insulin aspart protamine- aspart (NOVOLOG MIX 70/30) (70-30) 100 UNIT/ML injection Inject 0.05 mLs (5 Units total) into the skin 2 (two) times daily with a meal. Qty: 10 mL, Refills: 11      CONTINUE these medications which have NOT CHANGED   Details  allopurinol (ZYLOPRIM) 300 MG tablet Take 300 mg by mouth daily.    aspirin 81 MG tablet Take 81 mg by mouth daily.    benazepril (LOTENSIN) 40 MG tablet Take 40 mg by mouth daily.    donepezil (ARICEPT) 10 MG tablet Take 10 mg by mouth daily.    furosemide (LASIX) 40 MG tablet Take 120 mg by mouth 2 (two) times daily. Pt taking 3 tablets twice per day    Phenyleph-Doxylamine-DM-APAP (COLD & FLU NIGHTTIME/DAYTIME PO) Take 2 capsules by mouth daily as needed (for cold).       No Known Allergies Follow-up Information    Follow up with Charlotte Crumb A, MD. Call in 1 day.   Specialty:  Orthopedic Surgery   Why:  Please call the office 12/17 to determine when Dr. Burney Gauze needs to see him.  They are aware of him.   Contact information:   [address] [place] [address] [phone]        The results of significant diagnostics from this hospitalization (including imaging, microbiology, ancillary and laboratory) are listed below  for reference.    Significant Diagnostic Studies: Dg Chest 2 View  05/23/2014   CLINICAL DATA:  Found unresponsive, and hypoglycemic. Confusion. Diabetes. Gout. Hypertension. Dementia.  EXAM: CHEST  2 VIEW  COMPARISON:  03/16/2014  FINDINGS: Emphysema. Mild enlargement of the cardiopericardial silhouette, without edema. Faint atherosclerotic calcification in the aortic arch.  Thoracic spondylosis. No pleural effusion or significant airspace opacity. Prominence of stool in the upper abdominal colon.  IMPRESSION: 1. Mild enlargement of the cardiopericardial silhouette, without edema. 2. Emphysema. 3. Prominence of stool in the upper colon, query constipation.   Electronically Signed   By: Sherryl Barters M.D.   On: 05/23/2014 19:16   Dg Lumbar Spine 2-3  Views  05/23/2014   CLINICAL DATA:  Low back pain after fall.  EXAM: LUMBAR SPINE - 2-3 VIEW  COMPARISON:  None.  FINDINGS: Diffuse osteopenia is noted. Degenerative disc disease is noted at L2-3 and L3-4 with anterior osteophyte formation. No significant spondylolisthesis is noted. Mildly depressed superior endplate of L3 and L4 vertebral bodies are noted which most likely are chronic. Moderate degenerative disc disease is noted at L4-5 and L5-S1. Hypertrophy of posterior facet joints is seen at L3-4 and L4-5 secondary to degenerative joint disease.  IMPRESSION: Degenerative changes as described above. Mild superior endplate depression of L3 and L4 vertebral bodies is noted which most likely is chronic, but if patient is symptomatic in this area, CT scan may be performed for further evaluation.   Electronically Signed   By: Sabino Dick M.D.   On: 05/23/2014 22:10   Ct Head Wo Contrast  05/23/2014   CLINICAL DATA:  Dizziness.  Diabetes.  Foot swelling.  EXAM: CT HEAD WITHOUT CONTRAST  TECHNIQUE: Contiguous axial images were obtained from the base of the skull through the vertex without intravenous contrast.  COMPARISON:  04/26/2006  FINDINGS: Global  atrophy. Mild chronic ischemic changes in the periventricular white matter. No mass effect, midline shift, or acute hemorrhage. Mastoid air cells are clear. Mucosal thickening in the ethmoid air cells. 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 Intact cranium.  IMPRESSION: No acute intracranial pathology. Global atrophy and chronic ischemic changes are noted.   Electronically Signed   By: Maryclare Bean M.D.   On: 05/23/2014 18:52   Dg Hand Complete Right  05/22/2014   CLINICAL DATA:  Right hand swelling without injury.  EXAM: RIGHT HAND - COMPLETE 3+ VIEW  COMPARISON:  April 26, 2006.  FINDINGS: There is noted a mildly displaced fracture involving the dorsal aspect of the proximal base of the fifth distal phalanx. Mild degenerative changes seen involving the fourth metacarpophalangeal and proximal interphalangeal joints.  IMPRESSION: Moderately displaced fracture seen involving the proximal base of the fifth distal phalanx.   Electronically Signed   By: Sabino Dick M.D.   On: 05/22/2014 20:55    Microbiology: No results found for this or any previous visit (from the past 240 hour(s)).   Labs: Basic Metabolic Panel:  Recent Labs Lab 05/22/14 1840 05/23/14 1754 05/24/14 0554  NA 138 142 139  K 4.9 4.8 4.0  CL 103 104 103  CO2 $Re'21 23 24  'jaj$ GLUCOSE 106* 43* 118*  BUN $Re'13 12 10  'ohz$ CREATININE 1.25 1.13 0.94  CALCIUM 9.9 10.2 9.6   Liver Function Tests:  Recent Labs Lab 05/22/14 1840 05/23/14 1754 05/24/14 0554  AST 43* 43* 28  ALT $Re'19 21 16  'OAK$ ALKPHOS 83 80 75  BILITOT 0.2* 0.3 0.4  PROT 7.5 7.9 7.2  ALBUMIN 3.8 4.1 3.6   CBC:  Recent Labs Lab 05/22/14 1840 05/23/14 1754 05/24/14 0554  WBC 6.3 9.0 6.0  NEUTROABS 4.5 7.3  --   HGB 10.6* 11.3* 10.3*  HCT 34.4* 35.9* 34.0*  MCV 83.1 82.5 82.1  PLT 270 296 264   Cardiac Enzymes:  Recent Labs Lab 05/23/14 1754  TROPONINI <0.30   CBG:  Recent Labs Lab 05/24/14 1318 05/24/14 1646 05/24/14 2152 05/25/14 0808  05/25/14 0933  GLUCAP 246* 196* 108* 192* 182*       Signed:  Melton Alar, PA-C  [facility] 05/25/2014, 11:28 AM   Patient seen and examined, agree with Discharge summary done by Imogene Burn .  Patient stable for discharge.  70/30 dose adjusted. Started on antibiotics to terat UTI.  Leathia Farnell, Md.

## 2014-05-26 NOTE — Care Management Note (Signed)
    Page 1 of 1   05/26/2014     10:29:15 AM CARE MANAGEMENT NOTE 05/26/2014  Patient:  Carlos Schultz, Carlos Schultz   Account Number:  [id number]  Date Initiated:  05/26/2014  Documentation initiated by:  Tomi Bamberger  Subjective/Objective Assessment:   dx hypoglycemia, uti, small finger fx,  admit as observation.     Action/Plan:   Anticipated DC Date:  05/25/2014   Anticipated DC Plan:  [place]  CM consult      Choice offered to / List presented to:             Status of service:  Completed, signed off Medicare Important Message given?  NA - LOS <3 / Initial given by admissions (If response is "NO", the following Medicare IM given date fields will be blank) Date Medicare IM given:   Medicare IM given by:   Date Additional Medicare IM given:   Additional Medicare IM given by:    Discharge Disposition:  HOME/SELF CARE  Per UR Regulation:  Reviewed for med. necessity/level of care/duration of stay  If discussed at [place] of Stay Meetings, dates discussed:    Comments:

## 2014-05-27 LAB — URINE CULTURE: Colony Count: >=100000
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
## 2014-06-10 DIAGNOSIS — R41841 Cognitive communication deficit: Secondary | ICD-10-CM | POA: Diagnosis not present

## 2014-06-10 DIAGNOSIS — L84 Corns and callosities: Secondary | ICD-10-CM | POA: Diagnosis not present

## 2014-06-10 DIAGNOSIS — E1159 Type 2 diabetes mellitus with other circulatory complications: Secondary | ICD-10-CM | POA: Diagnosis not present

## 2014-06-10 DIAGNOSIS — E11649 Type 2 diabetes mellitus with hypoglycemia without coma: Secondary | ICD-10-CM | POA: Diagnosis not present

## 2014-06-10 DIAGNOSIS — R278 Other lack of coordination: Secondary | ICD-10-CM | POA: Diagnosis not present

## 2014-06-10 DIAGNOSIS — M1000 Idiopathic gout, unspecified site: Secondary | ICD-10-CM | POA: Diagnosis not present

## 2014-06-10 DIAGNOSIS — N390 Urinary tract infection, site not specified: Secondary | ICD-10-CM | POA: Diagnosis not present

## 2014-06-10 DIAGNOSIS — M79676 Pain in unspecified toe(s): Secondary | ICD-10-CM | POA: Diagnosis not present

## 2014-06-10 DIAGNOSIS — Z9181 History of falling: Secondary | ICD-10-CM | POA: Diagnosis not present

## 2014-06-10 DIAGNOSIS — B351 Tinea unguium: Secondary | ICD-10-CM | POA: Diagnosis not present

## 2014-06-10 DIAGNOSIS — R260 Ataxic gait: Secondary | ICD-10-CM | POA: Diagnosis not present

## 2014-06-10 DIAGNOSIS — M6281 Muscle weakness (generalized): Secondary | ICD-10-CM | POA: Diagnosis not present

## 2014-06-10 DIAGNOSIS — S62521D Displaced fracture of distal phalanx of right thumb, subsequent encounter for fracture with routine healing: Secondary | ICD-10-CM | POA: Diagnosis not present

## 2014-06-10 DIAGNOSIS — N3941 Urge incontinence: Secondary | ICD-10-CM | POA: Diagnosis not present

## 2014-06-13 DIAGNOSIS — E1159 Type 2 diabetes mellitus with other circulatory complications: Secondary | ICD-10-CM | POA: Diagnosis not present

## 2014-06-13 DIAGNOSIS — L84 Corns and callosities: Secondary | ICD-10-CM | POA: Diagnosis not present

## 2014-06-13 DIAGNOSIS — B351 Tinea unguium: Secondary | ICD-10-CM | POA: Diagnosis not present

## 2014-06-13 DIAGNOSIS — M1000 Idiopathic gout, unspecified site: Secondary | ICD-10-CM | POA: Diagnosis not present

## 2014-06-13 DIAGNOSIS — M79676 Pain in unspecified toe(s): Secondary | ICD-10-CM | POA: Diagnosis not present

## 2014-06-15 DIAGNOSIS — F0390 Unspecified dementia without behavioral disturbance: Secondary | ICD-10-CM | POA: Diagnosis not present

## 2014-06-15 DIAGNOSIS — I10 Essential (primary) hypertension: Secondary | ICD-10-CM | POA: Diagnosis not present

## 2014-06-15 DIAGNOSIS — I509 Heart failure, unspecified: Secondary | ICD-10-CM | POA: Diagnosis not present

## 2014-06-15 DIAGNOSIS — R0600 Dyspnea, unspecified: Secondary | ICD-10-CM | POA: Diagnosis not present

## 2014-06-15 DIAGNOSIS — E1165 Type 2 diabetes mellitus with hyperglycemia: Secondary | ICD-10-CM | POA: Diagnosis not present

## 2014-06-15 DIAGNOSIS — R0602 Shortness of breath: Secondary | ICD-10-CM | POA: Diagnosis not present

## 2014-06-15 DIAGNOSIS — I110 Hypertensive heart disease with heart failure: Secondary | ICD-10-CM | POA: Diagnosis not present

## 2014-06-20 DIAGNOSIS — E139 Other specified diabetes mellitus without complications: Secondary | ICD-10-CM | POA: Diagnosis not present

## 2014-06-20 DIAGNOSIS — N289 Disorder of kidney and ureter, unspecified: Secondary | ICD-10-CM | POA: Diagnosis not present

## 2014-06-20 DIAGNOSIS — E11649 Type 2 diabetes mellitus with hypoglycemia without coma: Secondary | ICD-10-CM | POA: Diagnosis not present

## 2014-06-20 DIAGNOSIS — R260 Ataxic gait: Secondary | ICD-10-CM | POA: Diagnosis not present

## 2014-06-20 DIAGNOSIS — R269 Unspecified abnormalities of gait and mobility: Secondary | ICD-10-CM | POA: Diagnosis not present

## 2014-06-21 DIAGNOSIS — E785 Hyperlipidemia, unspecified: Secondary | ICD-10-CM | POA: Diagnosis not present

## 2014-06-21 DIAGNOSIS — E7211 Homocystinuria: Secondary | ICD-10-CM | POA: Diagnosis not present

## 2014-06-21 DIAGNOSIS — I10 Essential (primary) hypertension: Secondary | ICD-10-CM | POA: Diagnosis not present

## 2014-06-21 DIAGNOSIS — E1165 Type 2 diabetes mellitus with hyperglycemia: Secondary | ICD-10-CM | POA: Diagnosis not present

## 2014-06-21 DIAGNOSIS — N183 Chronic kidney disease, stage 3 (moderate): Secondary | ICD-10-CM | POA: Diagnosis not present

## 2014-06-22 DIAGNOSIS — E11649 Type 2 diabetes mellitus with hypoglycemia without coma: Secondary | ICD-10-CM | POA: Diagnosis not present

## 2014-06-22 DIAGNOSIS — R262 Difficulty in walking, not elsewhere classified: Secondary | ICD-10-CM | POA: Diagnosis not present

## 2014-06-30 ENCOUNTER — Other Ambulatory Visit: Payer: PRIVATE HEALTH INSURANCE

## 2014-06-30 ENCOUNTER — Ambulatory Visit: Payer: PRIVATE HEALTH INSURANCE | Admitting: Oncology

## 2014-07-11 DIAGNOSIS — I10 Essential (primary) hypertension: Secondary | ICD-10-CM | POA: Diagnosis not present

## 2014-07-11 DIAGNOSIS — N183 Chronic kidney disease, stage 3 (moderate): Secondary | ICD-10-CM | POA: Diagnosis not present

## 2014-07-11 DIAGNOSIS — E785 Hyperlipidemia, unspecified: Secondary | ICD-10-CM | POA: Diagnosis not present

## 2014-07-11 DIAGNOSIS — E7211 Homocystinuria: Secondary | ICD-10-CM | POA: Diagnosis not present

## 2014-07-11 DIAGNOSIS — E1165 Type 2 diabetes mellitus with hyperglycemia: Secondary | ICD-10-CM | POA: Diagnosis not present

## 2014-08-01 DIAGNOSIS — I10 Essential (primary) hypertension: Secondary | ICD-10-CM | POA: Diagnosis not present

## 2014-08-02 DIAGNOSIS — I10 Essential (primary) hypertension: Secondary | ICD-10-CM | POA: Diagnosis not present

## 2014-08-03 DIAGNOSIS — I10 Essential (primary) hypertension: Secondary | ICD-10-CM | POA: Diagnosis not present

## 2014-08-04 DIAGNOSIS — I10 Essential (primary) hypertension: Secondary | ICD-10-CM | POA: Diagnosis not present

## 2014-08-05 DIAGNOSIS — I10 Essential (primary) hypertension: Secondary | ICD-10-CM | POA: Diagnosis not present

## 2014-08-08 DIAGNOSIS — E1165 Type 2 diabetes mellitus with hyperglycemia: Secondary | ICD-10-CM | POA: Diagnosis not present

## 2014-08-08 DIAGNOSIS — Z7689 Persons encountering health services in other specified circumstances: Secondary | ICD-10-CM | POA: Diagnosis not present

## 2014-08-08 DIAGNOSIS — D638 Anemia in other chronic diseases classified elsewhere: Secondary | ICD-10-CM | POA: Diagnosis not present

## 2014-08-08 DIAGNOSIS — E785 Hyperlipidemia, unspecified: Secondary | ICD-10-CM | POA: Diagnosis not present

## 2014-08-08 DIAGNOSIS — I10 Essential (primary) hypertension: Secondary | ICD-10-CM | POA: Diagnosis not present

## 2014-08-09 DIAGNOSIS — I10 Essential (primary) hypertension: Secondary | ICD-10-CM | POA: Diagnosis not present

## 2014-08-10 DIAGNOSIS — I10 Essential (primary) hypertension: Secondary | ICD-10-CM | POA: Diagnosis not present

## 2014-08-11 DIAGNOSIS — I10 Essential (primary) hypertension: Secondary | ICD-10-CM | POA: Diagnosis not present

## 2014-08-12 DIAGNOSIS — I10 Essential (primary) hypertension: Secondary | ICD-10-CM | POA: Diagnosis not present

## 2014-08-15 DIAGNOSIS — I10 Essential (primary) hypertension: Secondary | ICD-10-CM | POA: Diagnosis not present

## 2014-08-16 DIAGNOSIS — I10 Essential (primary) hypertension: Secondary | ICD-10-CM | POA: Diagnosis not present

## 2014-08-17 DIAGNOSIS — I10 Essential (primary) hypertension: Secondary | ICD-10-CM | POA: Diagnosis not present

## 2014-08-18 DIAGNOSIS — I10 Essential (primary) hypertension: Secondary | ICD-10-CM | POA: Diagnosis not present

## 2014-08-19 DIAGNOSIS — I10 Essential (primary) hypertension: Secondary | ICD-10-CM | POA: Diagnosis not present

## 2014-08-22 DIAGNOSIS — I10 Essential (primary) hypertension: Secondary | ICD-10-CM | POA: Diagnosis not present

## 2014-08-23 DIAGNOSIS — I10 Essential (primary) hypertension: Secondary | ICD-10-CM | POA: Diagnosis not present

## 2014-08-24 DIAGNOSIS — I10 Essential (primary) hypertension: Secondary | ICD-10-CM | POA: Diagnosis not present

## 2014-08-25 DIAGNOSIS — I10 Essential (primary) hypertension: Secondary | ICD-10-CM | POA: Diagnosis not present

## 2014-08-26 DIAGNOSIS — I10 Essential (primary) hypertension: Secondary | ICD-10-CM | POA: Diagnosis not present

## 2014-08-29 DIAGNOSIS — I10 Essential (primary) hypertension: Secondary | ICD-10-CM | POA: Diagnosis not present

## 2014-08-30 DIAGNOSIS — I10 Essential (primary) hypertension: Secondary | ICD-10-CM | POA: Diagnosis not present

## 2014-08-31 DIAGNOSIS — I10 Essential (primary) hypertension: Secondary | ICD-10-CM | POA: Diagnosis not present

## 2014-09-01 DIAGNOSIS — I10 Essential (primary) hypertension: Secondary | ICD-10-CM | POA: Diagnosis not present

## 2014-09-02 DIAGNOSIS — I10 Essential (primary) hypertension: Secondary | ICD-10-CM | POA: Diagnosis not present

## 2014-09-05 DIAGNOSIS — I10 Essential (primary) hypertension: Secondary | ICD-10-CM | POA: Diagnosis not present

## 2014-09-06 DIAGNOSIS — E785 Hyperlipidemia, unspecified: Secondary | ICD-10-CM | POA: Diagnosis not present

## 2014-09-06 DIAGNOSIS — R05 Cough: Secondary | ICD-10-CM | POA: Diagnosis not present

## 2014-09-06 DIAGNOSIS — I10 Essential (primary) hypertension: Secondary | ICD-10-CM | POA: Diagnosis not present

## 2014-09-06 DIAGNOSIS — E1165 Type 2 diabetes mellitus with hyperglycemia: Secondary | ICD-10-CM | POA: Diagnosis not present

## 2014-09-06 DIAGNOSIS — N183 Chronic kidney disease, stage 3 (moderate): Secondary | ICD-10-CM | POA: Diagnosis not present

## 2014-09-07 DIAGNOSIS — I10 Essential (primary) hypertension: Secondary | ICD-10-CM | POA: Diagnosis not present

## 2014-09-08 DIAGNOSIS — I10 Essential (primary) hypertension: Secondary | ICD-10-CM | POA: Diagnosis not present

## 2014-09-09 DIAGNOSIS — I10 Essential (primary) hypertension: Secondary | ICD-10-CM | POA: Diagnosis not present

## 2014-09-12 DIAGNOSIS — I10 Essential (primary) hypertension: Secondary | ICD-10-CM | POA: Diagnosis not present

## 2014-09-13 DIAGNOSIS — E785 Hyperlipidemia, unspecified: Secondary | ICD-10-CM | POA: Diagnosis not present

## 2014-09-13 DIAGNOSIS — E1165 Type 2 diabetes mellitus with hyperglycemia: Secondary | ICD-10-CM | POA: Diagnosis not present

## 2014-09-13 DIAGNOSIS — N183 Chronic kidney disease, stage 3 (moderate): Secondary | ICD-10-CM | POA: Diagnosis not present

## 2014-09-13 DIAGNOSIS — F0390 Unspecified dementia without behavioral disturbance: Secondary | ICD-10-CM | POA: Diagnosis not present

## 2014-09-13 DIAGNOSIS — I10 Essential (primary) hypertension: Secondary | ICD-10-CM | POA: Diagnosis not present

## 2014-09-14 DIAGNOSIS — I10 Essential (primary) hypertension: Secondary | ICD-10-CM | POA: Diagnosis not present

## 2014-09-15 DIAGNOSIS — I10 Essential (primary) hypertension: Secondary | ICD-10-CM | POA: Diagnosis not present

## 2014-09-16 DIAGNOSIS — I10 Essential (primary) hypertension: Secondary | ICD-10-CM | POA: Diagnosis not present

## 2014-09-19 DIAGNOSIS — I10 Essential (primary) hypertension: Secondary | ICD-10-CM | POA: Diagnosis not present

## 2014-09-20 DIAGNOSIS — I10 Essential (primary) hypertension: Secondary | ICD-10-CM | POA: Diagnosis not present

## 2014-09-21 DIAGNOSIS — I10 Essential (primary) hypertension: Secondary | ICD-10-CM | POA: Diagnosis not present

## 2014-09-22 DIAGNOSIS — I10 Essential (primary) hypertension: Secondary | ICD-10-CM | POA: Diagnosis not present

## 2014-09-23 DIAGNOSIS — I10 Essential (primary) hypertension: Secondary | ICD-10-CM | POA: Diagnosis not present

## 2014-09-26 DIAGNOSIS — I10 Essential (primary) hypertension: Secondary | ICD-10-CM | POA: Diagnosis not present

## 2014-09-27 DIAGNOSIS — I10 Essential (primary) hypertension: Secondary | ICD-10-CM | POA: Diagnosis not present

## 2014-09-28 DIAGNOSIS — I10 Essential (primary) hypertension: Secondary | ICD-10-CM | POA: Diagnosis not present

## 2014-09-29 DIAGNOSIS — I10 Essential (primary) hypertension: Secondary | ICD-10-CM | POA: Diagnosis not present

## 2014-09-30 DIAGNOSIS — I10 Essential (primary) hypertension: Secondary | ICD-10-CM | POA: Diagnosis not present

## 2014-10-03 DIAGNOSIS — I10 Essential (primary) hypertension: Secondary | ICD-10-CM | POA: Diagnosis not present

## 2014-10-04 DIAGNOSIS — I10 Essential (primary) hypertension: Secondary | ICD-10-CM | POA: Diagnosis not present

## 2014-10-05 DIAGNOSIS — I10 Essential (primary) hypertension: Secondary | ICD-10-CM | POA: Diagnosis not present

## 2014-10-06 DIAGNOSIS — I10 Essential (primary) hypertension: Secondary | ICD-10-CM | POA: Diagnosis not present

## 2014-10-07 DIAGNOSIS — I10 Essential (primary) hypertension: Secondary | ICD-10-CM | POA: Diagnosis not present

## 2014-10-10 DIAGNOSIS — I10 Essential (primary) hypertension: Secondary | ICD-10-CM | POA: Diagnosis not present

## 2014-10-11 DIAGNOSIS — I10 Essential (primary) hypertension: Secondary | ICD-10-CM | POA: Diagnosis not present

## 2014-10-12 DIAGNOSIS — I10 Essential (primary) hypertension: Secondary | ICD-10-CM | POA: Diagnosis not present

## 2014-10-13 DIAGNOSIS — I10 Essential (primary) hypertension: Secondary | ICD-10-CM | POA: Diagnosis not present

## 2014-10-14 DIAGNOSIS — I10 Essential (primary) hypertension: Secondary | ICD-10-CM | POA: Diagnosis not present

## 2014-10-17 DIAGNOSIS — I10 Essential (primary) hypertension: Secondary | ICD-10-CM | POA: Diagnosis not present

## 2014-10-18 DIAGNOSIS — I10 Essential (primary) hypertension: Secondary | ICD-10-CM | POA: Diagnosis not present

## 2014-10-19 DIAGNOSIS — I10 Essential (primary) hypertension: Secondary | ICD-10-CM | POA: Diagnosis not present

## 2014-10-20 DIAGNOSIS — I10 Essential (primary) hypertension: Secondary | ICD-10-CM | POA: Diagnosis not present

## 2014-10-21 DIAGNOSIS — I10 Essential (primary) hypertension: Secondary | ICD-10-CM | POA: Diagnosis not present

## 2014-10-24 DIAGNOSIS — I10 Essential (primary) hypertension: Secondary | ICD-10-CM | POA: Diagnosis not present

## 2014-10-25 DIAGNOSIS — I10 Essential (primary) hypertension: Secondary | ICD-10-CM | POA: Diagnosis not present

## 2014-10-26 DIAGNOSIS — I10 Essential (primary) hypertension: Secondary | ICD-10-CM | POA: Diagnosis not present

## 2014-10-27 DIAGNOSIS — I10 Essential (primary) hypertension: Secondary | ICD-10-CM | POA: Diagnosis not present

## 2014-10-28 DIAGNOSIS — I10 Essential (primary) hypertension: Secondary | ICD-10-CM | POA: Diagnosis not present

## 2014-10-31 DIAGNOSIS — I10 Essential (primary) hypertension: Secondary | ICD-10-CM | POA: Diagnosis not present

## 2014-11-01 DIAGNOSIS — I10 Essential (primary) hypertension: Secondary | ICD-10-CM | POA: Diagnosis not present

## 2014-11-02 DIAGNOSIS — I10 Essential (primary) hypertension: Secondary | ICD-10-CM | POA: Diagnosis not present

## 2014-11-03 DIAGNOSIS — I10 Essential (primary) hypertension: Secondary | ICD-10-CM | POA: Diagnosis not present

## 2014-11-04 DIAGNOSIS — I10 Essential (primary) hypertension: Secondary | ICD-10-CM | POA: Diagnosis not present

## 2014-11-07 DIAGNOSIS — I10 Essential (primary) hypertension: Secondary | ICD-10-CM | POA: Diagnosis not present

## 2014-11-08 DIAGNOSIS — I10 Essential (primary) hypertension: Secondary | ICD-10-CM | POA: Diagnosis not present

## 2014-11-09 DIAGNOSIS — I10 Essential (primary) hypertension: Secondary | ICD-10-CM | POA: Diagnosis not present

## 2014-11-10 DIAGNOSIS — I10 Essential (primary) hypertension: Secondary | ICD-10-CM | POA: Diagnosis not present

## 2014-11-11 DIAGNOSIS — I10 Essential (primary) hypertension: Secondary | ICD-10-CM | POA: Diagnosis not present

## 2014-11-14 DIAGNOSIS — I10 Essential (primary) hypertension: Secondary | ICD-10-CM | POA: Diagnosis not present

## 2014-11-15 DIAGNOSIS — I10 Essential (primary) hypertension: Secondary | ICD-10-CM | POA: Diagnosis not present

## 2014-11-16 DIAGNOSIS — I10 Essential (primary) hypertension: Secondary | ICD-10-CM | POA: Diagnosis not present

## 2014-11-17 DIAGNOSIS — I10 Essential (primary) hypertension: Secondary | ICD-10-CM | POA: Diagnosis not present

## 2014-11-18 DIAGNOSIS — I10 Essential (primary) hypertension: Secondary | ICD-10-CM | POA: Diagnosis not present

## 2014-11-21 DIAGNOSIS — I10 Essential (primary) hypertension: Secondary | ICD-10-CM | POA: Diagnosis not present

## 2014-11-22 DIAGNOSIS — I10 Essential (primary) hypertension: Secondary | ICD-10-CM | POA: Diagnosis not present

## 2014-11-23 DIAGNOSIS — I10 Essential (primary) hypertension: Secondary | ICD-10-CM | POA: Diagnosis not present

## 2014-11-24 DIAGNOSIS — I10 Essential (primary) hypertension: Secondary | ICD-10-CM | POA: Diagnosis not present

## 2014-11-25 DIAGNOSIS — I10 Essential (primary) hypertension: Secondary | ICD-10-CM | POA: Diagnosis not present

## 2015-04-04 DIAGNOSIS — E1165 Type 2 diabetes mellitus with hyperglycemia: Secondary | ICD-10-CM | POA: Diagnosis not present

## 2015-04-04 DIAGNOSIS — E785 Hyperlipidemia, unspecified: Secondary | ICD-10-CM | POA: Diagnosis not present

## 2015-04-04 DIAGNOSIS — F0390 Unspecified dementia without behavioral disturbance: Secondary | ICD-10-CM | POA: Diagnosis not present

## 2015-04-04 DIAGNOSIS — Z0000 Encounter for general adult medical examination without abnormal findings: Secondary | ICD-10-CM | POA: Diagnosis not present

## 2015-04-04 DIAGNOSIS — N183 Chronic kidney disease, stage 3 (moderate): Secondary | ICD-10-CM | POA: Diagnosis not present

## 2015-04-04 DIAGNOSIS — D638 Anemia in other chronic diseases classified elsewhere: Secondary | ICD-10-CM | POA: Diagnosis not present

## 2015-04-04 DIAGNOSIS — Z23 Encounter for immunization: Secondary | ICD-10-CM | POA: Diagnosis not present

## 2015-04-13 DIAGNOSIS — M2040 Other hammer toe(s) (acquired), unspecified foot: Secondary | ICD-10-CM | POA: Diagnosis not present

## 2015-04-13 DIAGNOSIS — M792 Neuralgia and neuritis, unspecified: Secondary | ICD-10-CM | POA: Diagnosis not present

## 2015-04-13 DIAGNOSIS — L97519 Non-pressure chronic ulcer of other part of right foot with unspecified severity: Secondary | ICD-10-CM | POA: Diagnosis not present

## 2015-04-20 DIAGNOSIS — L97519 Non-pressure chronic ulcer of other part of right foot with unspecified severity: Secondary | ICD-10-CM | POA: Diagnosis not present

## 2015-04-27 DIAGNOSIS — L97519 Non-pressure chronic ulcer of other part of right foot with unspecified severity: Secondary | ICD-10-CM | POA: Diagnosis not present

## 2015-05-09 DIAGNOSIS — E1165 Type 2 diabetes mellitus with hyperglycemia: Secondary | ICD-10-CM | POA: Diagnosis not present

## 2015-05-09 DIAGNOSIS — E785 Hyperlipidemia, unspecified: Secondary | ICD-10-CM | POA: Diagnosis not present

## 2015-05-09 DIAGNOSIS — I509 Heart failure, unspecified: Secondary | ICD-10-CM | POA: Diagnosis not present

## 2015-05-09 DIAGNOSIS — I10 Essential (primary) hypertension: Secondary | ICD-10-CM | POA: Diagnosis not present

## 2015-05-09 DIAGNOSIS — N183 Chronic kidney disease, stage 3 (moderate): Secondary | ICD-10-CM | POA: Diagnosis not present

## 2015-05-11 DIAGNOSIS — L97519 Non-pressure chronic ulcer of other part of right foot with unspecified severity: Secondary | ICD-10-CM | POA: Diagnosis not present

## 2015-05-18 DIAGNOSIS — M24574 Contracture, right foot: Secondary | ICD-10-CM | POA: Diagnosis not present

## 2015-05-25 DIAGNOSIS — B079 Viral wart, unspecified: Secondary | ICD-10-CM | POA: Diagnosis not present

## 2015-05-25 DIAGNOSIS — D492 Neoplasm of unspecified behavior of bone, soft tissue, and skin: Secondary | ICD-10-CM | POA: Diagnosis not present

## 2016-01-04 ENCOUNTER — Other Ambulatory Visit: Payer: Self-pay | Admitting: Urology

## 2016-01-04 DIAGNOSIS — C61 Malignant neoplasm of prostate: Secondary | ICD-10-CM

## 2016-01-15 ENCOUNTER — Encounter (HOSPITAL_COMMUNITY)
Admission: RE | Admit: 2016-01-15 | Discharge: 2016-01-15 | Disposition: A | Discharge status: Home / Self Care | Payer: Medicare Other | Source: Ambulatory Visit | Attending: Urology | Admitting: Urology

## 2016-01-15 DIAGNOSIS — C61 Malignant neoplasm of prostate: Secondary | ICD-10-CM | POA: Insufficient documentation

## 2016-01-15 MED ORDER — TECHNETIUM TC 99M MEDRONATE IV KIT
24.4000 | PACK | Freq: Once | INTRAVENOUS | Status: AC | PRN
  Administered 2016-01-15: 24.4 via INTRAVENOUS

## 2016-09-19 ENCOUNTER — Other Ambulatory Visit: Payer: Self-pay | Admitting: Urology

## 2016-10-02 NOTE — Patient Instructions (Addendum)
Carlos Schultz  10/02/2016   Your procedure is scheduled on: 10/10/2016    Report to [place]  Entranceand take [place] elevators to 3rd Floor to [place] at 0830am.      Call this number if you have problems the morning of surgery [phone]    Remember: ONLY 1 PERSON MAY GO WITH YOU TO SHORT STAY TO GET  READY MORNING OF [place].  Do not eat food or drink liquids :After Midnight.     Take these medicines the morning of surgery with A SIP OF WATER: Amlodipoine ( Norvasc)                                 You may not have any metal on your body including hair pins and              piercings  Do not wear jewelry, , lotions, powders or perfumes, deodorant                        Men may shave face and neck.   Do not bring valuables to the hospital. [place].  Contacts, dentures or bridgework may not be worn into surgery.  Leave suitcase in the car. After surgery it may be brought to your room.                       Please read over the following fact sheets you were given: _____________________________________________________________________             [facility] - Preparing for Surgery Before surgery, you can play an important role.  Because skin is not sterile, your skin needs to be as free of germs as possible.  You can reduce the number of germs on your skin by washing with CHG (chlorahexidine gluconate) soap before surgery.  CHG is an antiseptic cleaner which kills germs and bonds with the skin to continue killing germs even after washing. Please DO NOT use if you have an allergy to CHG or antibacterial soaps.  If your skin becomes reddened/irritated stop using the CHG and inform your nurse when you arrive at Short Stay. Do not shave (including legs and underarms) for at least 48 hours prior to the first CHG shower.  You may shave your face/neck. Please follow these instructions  carefully:  1.  Shower with CHG Soap the night before surgery and the  morning of Surgery.  2.  If you choose to wash your hair, wash your hair first as usual with your  normal  shampoo.  3.  After you shampoo, rinse your hair and body thoroughly to remove the  shampoo.                           4.  Use CHG as you would any other liquid soap.  You can apply chg directly  to the skin and wash                       Gently with a scrungie or clean washcloth.  5.  Apply the CHG Soap to your body ONLY FROM  THE NECK DOWN.   Do not use on face/ open                           Wound or open sores. Avoid contact with eyes, ears mouth and genitals (private parts).                       Wash face,  Genitals (private parts) with your normal soap.             6.  Wash thoroughly, paying special attention to the area where your surgery  will be performed.  7.  Thoroughly rinse your body with warm water from the neck down.  8.  DO NOT shower/wash with your normal soap after using and rinsing off  the CHG Soap.                9.  Pat yourself dry with a clean towel.            10.  Wear clean pajamas.            11.  Place clean sheets on your bed the night of your first shower and do not  sleep with pets. Day of Surgery : Do not apply any lotions/deodorants the morning of surgery.  Please wear clean clothes to the hospital/surgery center.  FAILURE TO FOLLOW THESE INSTRUCTIONS MAY RESULT IN THE CANCELLATION OF YOUR SURGERY PATIENT SIGNATURE_________________________________  NURSE SIGNATURE__________________________________  ________________________________________________________________________ How to Manage Your Diabetes Before and After Surgery  Why is it important to control my blood sugar before and after surgery? . Improving blood sugar levels before and after surgery helps healing and can limit problems. . A way of improving blood sugar control is eating a healthy diet by: o  Eating less sugar and  carbohydrates o  Increasing activity/exercise o  Talking with your doctor about reaching your blood sugar goals . High blood sugars (greater than 180 mg/dL) can raise your risk of infections and slow your recovery, so you will need to focus on controlling your diabetes during the weeks before surgery. . Make sure that the doctor who takes care of your diabetes knows about your planned surgery including the date and location.  How do I manage my blood sugar before surgery? . Check your blood sugar at least 4 times a day, starting 2 days before surgery, to make sure that the level is not too high or low. o Check your blood sugar the morning of your surgery when you wake up and every 2 hours until you get to the Short Stay unit. . If your blood sugar is less than 70 mg/dL, you will need to treat for low blood sugar: o Do not take insulin. o Treat a low blood sugar (less than 70 mg/dL) with  cup of clear juice (cranberry or apple), 4 glucose tablets, OR glucose gel. o Recheck blood sugar in 15 minutes after treatment (to make sure it is greater than 70 mg/dL). If your blood sugar is not greater than 70 mg/dL on recheck, call [phone] for further instructions. . Report your blood sugar to the short stay nurse when you get to Short Stay.  . If you are admitted to the hospital after surgery: o Your blood sugar will be checked by the staff and you will probably be given insulin after surgery (instead of oral diabetes medicines) to make sure you have good blood sugar levels. o  The goal for blood sugar control after surgery is 80-180 mg/dL.   WHAT DO I DO ABOUT MY DIABETES MEDICATION?  Marland Kitchen Do not take oral diabetes medicines (pills) the morning of surgery. . Take usual dose of Humalog Insulin the am of the day before surgery.   .  Take 14 units of Humalog Insulin the evening dose the  day before surgery.   . On the day of surgery DO NOT TAKE AM Insulin.   .       . .  . If your CBG is  greater than 220 mg/dL, you may take  of your sliding scale  . (correction) dose of insulin.    Patient Signature:  Date:   Nurse Signature:  Date:   Reviewed and Endorsed by [facility] Patient Education Committee, August 2015

## 2016-10-08 ENCOUNTER — Encounter (HOSPITAL_COMMUNITY)
Admission: RE | Admit: 2016-10-08 | Discharge: 2016-10-08 | Disposition: A | Discharge status: Home / Self Care | Payer: Medicare Other | Source: Ambulatory Visit | Attending: Urology | Admitting: Urology

## 2016-10-08 ENCOUNTER — Encounter (HOSPITAL_COMMUNITY): Payer: Self-pay

## 2016-10-08 DIAGNOSIS — R350 Frequency of micturition: Secondary | ICD-10-CM | POA: Diagnosis not present

## 2016-10-08 DIAGNOSIS — R351 Nocturia: Secondary | ICD-10-CM | POA: Diagnosis not present

## 2016-10-08 DIAGNOSIS — I252 Old myocardial infarction: Secondary | ICD-10-CM | POA: Diagnosis not present

## 2016-10-08 DIAGNOSIS — C61 Malignant neoplasm of prostate: Secondary | ICD-10-CM | POA: Diagnosis not present

## 2016-10-08 DIAGNOSIS — Z7982 Long term (current) use of aspirin: Secondary | ICD-10-CM | POA: Diagnosis not present

## 2016-10-08 DIAGNOSIS — N320 Bladder-neck obstruction: Secondary | ICD-10-CM | POA: Diagnosis present

## 2016-10-08 DIAGNOSIS — E785 Hyperlipidemia, unspecified: Secondary | ICD-10-CM | POA: Diagnosis not present

## 2016-10-08 DIAGNOSIS — Z79899 Other long term (current) drug therapy: Secondary | ICD-10-CM | POA: Diagnosis not present

## 2016-10-08 DIAGNOSIS — Z87891 Personal history of nicotine dependence: Secondary | ICD-10-CM | POA: Diagnosis not present

## 2016-10-08 DIAGNOSIS — Z794 Long term (current) use of insulin: Secondary | ICD-10-CM | POA: Diagnosis not present

## 2016-10-08 DIAGNOSIS — K5900 Constipation, unspecified: Secondary | ICD-10-CM | POA: Diagnosis not present

## 2016-10-08 HISTORY — DX: Chronic kidney disease, unspecified: N18.9

## 2016-10-08 LAB — CBC
HCT: 32.7 % — ABNORMAL LOW (ref 39.0–52.0)
HEMOGLOBIN: 9.9 g/dL — AB (ref 13.0–17.0)
MCH: 24.7 pg — ABNORMAL LOW (ref 26.0–34.0)
MCHC: 30.3 g/dL (ref 30.0–36.0)
MCV: 81.5 fL (ref 78.0–100.0)
PLATELETS: 301 10*3/uL (ref 150–400)
RBC: 4.01 MIL/uL — AB (ref 4.22–5.81)
RDW: 13.1 % (ref 11.5–15.5)
WBC: 6.2 10*3/uL (ref 4.0–10.5)

## 2016-10-08 LAB — COMPREHENSIVE METABOLIC PANEL
ALBUMIN: 4 g/dL (ref 3.5–5.0)
ALK PHOS: 53 U/L (ref 38–126)
ALT: 14 U/L — AB (ref 17–63)
AST: 20 U/L (ref 15–41)
Anion gap: 9 (ref 5–15)
BUN: 29 mg/dL — AB (ref 6–20)
CALCIUM: 9.9 mg/dL (ref 8.9–10.3)
CHLORIDE: 99 mmol/L — AB (ref 101–111)
CO2: 26 mmol/L (ref 22–32)
CREATININE: 1.4 mg/dL — AB (ref 0.61–1.24)
GFR calc Af Amer: 54 mL/min — ABNORMAL LOW (ref >60)
GFR calc non Af Amer: 47 mL/min — ABNORMAL LOW (ref >60)
GLUCOSE: 344 mg/dL — AB (ref 65–99)
Potassium: 5.3 mmol/L — ABNORMAL HIGH (ref 3.5–5.1)
SODIUM: 134 mmol/L — AB (ref 135–145)
Total Bilirubin: 0.5 mg/dL (ref 0.3–1.2)
Total Protein: 7.4 g/dL (ref 6.5–8.1)

## 2016-10-08 LAB — GLUCOSE, CAPILLARY: Glucose-Capillary: 322 mg/dL — ABNORMAL HIGH (ref 65–99)

## 2016-10-08 NOTE — Progress Notes (Signed)
Spoke with diabetic coordinator regarding dose of Insulin  day before surgery and day of surgery.  Instructed to tell patient to take usual am dose of Insulin the day before surgery and 70% of pm dose of Insulin the pm dose day before surgery and no Insulin the am of surgery.

## 2016-10-08 NOTE — Progress Notes (Addendum)
Cbc and CMp done 10/08/16 faxed via epic to Dr Louis Meckel.   HGB A1C done 09/12/16 faxed via epic to Dr Louis Meckel with received confirmation.

## 2016-10-09 LAB — URINE CULTURE: Culture: 60000 — AB

## 2016-10-09 NOTE — Progress Notes (Signed)
Final  EKG done 10/08/2016- epic

## 2016-10-09 NOTE — Progress Notes (Signed)
Nephew present at preop appt.  Reviewed preop nistructions with nephew,  Nephew and patient have copy of preop instructions.

## 2016-10-10 ENCOUNTER — Encounter (HOSPITAL_COMMUNITY)
Admission: RE | Disposition: A | Discharge status: Home / Self Care | Payer: Self-pay | Source: Ambulatory Visit | Attending: Urology

## 2016-10-10 ENCOUNTER — Ambulatory Visit (HOSPITAL_COMMUNITY): Payer: Medicare Other | Admitting: Anesthesiology

## 2016-10-10 ENCOUNTER — Encounter (HOSPITAL_COMMUNITY): Payer: Self-pay | Admitting: *Deleted

## 2016-10-10 ENCOUNTER — Observation Stay (HOSPITAL_COMMUNITY)
Admission: RE | Admit: 2016-10-10 | Discharge: 2016-10-11 | Disposition: A | Discharge status: Home / Self Care | Payer: Medicare Other | Source: Ambulatory Visit | Attending: Urology | Admitting: Urology

## 2016-10-10 DIAGNOSIS — Z79899 Other long term (current) drug therapy: Secondary | ICD-10-CM | POA: Insufficient documentation

## 2016-10-10 DIAGNOSIS — C61 Malignant neoplasm of prostate: Principal | ICD-10-CM | POA: Insufficient documentation

## 2016-10-10 DIAGNOSIS — R351 Nocturia: Secondary | ICD-10-CM | POA: Insufficient documentation

## 2016-10-10 DIAGNOSIS — Z794 Long term (current) use of insulin: Secondary | ICD-10-CM | POA: Insufficient documentation

## 2016-10-10 DIAGNOSIS — K5900 Constipation, unspecified: Secondary | ICD-10-CM | POA: Insufficient documentation

## 2016-10-10 DIAGNOSIS — Z7982 Long term (current) use of aspirin: Secondary | ICD-10-CM | POA: Insufficient documentation

## 2016-10-10 DIAGNOSIS — Z87891 Personal history of nicotine dependence: Secondary | ICD-10-CM | POA: Insufficient documentation

## 2016-10-10 DIAGNOSIS — R350 Frequency of micturition: Secondary | ICD-10-CM | POA: Insufficient documentation

## 2016-10-10 DIAGNOSIS — I252 Old myocardial infarction: Secondary | ICD-10-CM | POA: Insufficient documentation

## 2016-10-10 DIAGNOSIS — E785 Hyperlipidemia, unspecified: Secondary | ICD-10-CM | POA: Insufficient documentation

## 2016-10-10 DIAGNOSIS — N320 Bladder-neck obstruction: Secondary | ICD-10-CM | POA: Diagnosis not present

## 2016-10-10 HISTORY — PX: TRANSURETHRAL RESECTION OF PROSTATE: SHX73

## 2016-10-10 LAB — GLUCOSE, CAPILLARY
GLUCOSE-CAPILLARY: 122 mg/dL — AB (ref 65–99)
GLUCOSE-CAPILLARY: 289 mg/dL — AB (ref 65–99)
GLUCOSE-CAPILLARY: 331 mg/dL — AB (ref 65–99)
GLUCOSE-CAPILLARY: 80 mg/dL (ref 65–99)
GLUCOSE-CAPILLARY: 88 mg/dL (ref 65–99)
Glucose-Capillary: 106 mg/dL — ABNORMAL HIGH (ref 65–99)
Glucose-Capillary: 173 mg/dL — ABNORMAL HIGH (ref 65–99)
Glucose-Capillary: 172 mg/dL — ABNORMAL HIGH (ref 65–99)

## 2016-10-10 SURGERY — TURP (TRANSURETHRAL RESECTION OF PROSTATE)
Anesthesia: General

## 2016-10-10 MED ORDER — ROCURONIUM BROMIDE 10 MG/ML (PF) SYRINGE
PREFILLED_SYRINGE | INTRAVENOUS | Status: DC | PRN
  Administered 2016-10-10: 40 mg via INTRAVENOUS

## 2016-10-10 MED ORDER — DOCUSATE SODIUM 100 MG PO CAPS
100.0000 mg | ORAL_CAPSULE | Freq: Two times a day (BID) | ORAL | Status: DC
  Administered 2016-10-10 – 2016-10-11 (×2): 100 mg via ORAL
  Filled 2016-10-10 (×2): qty 1

## 2016-10-10 MED ORDER — INSULIN ASPART 100 UNIT/ML ~~LOC~~ SOLN
0.0000 [IU] | SUBCUTANEOUS | Status: DC
Start: 2016-10-10 — End: 2016-10-11
  Administered 2016-10-10 (×2): 4 [IU] via SUBCUTANEOUS
  Administered 2016-10-11: 7 [IU] via SUBCUTANEOUS

## 2016-10-10 MED ORDER — INSULIN ASPART 100 UNIT/ML ~~LOC~~ SOLN
7.0000 [IU] | Freq: Once | SUBCUTANEOUS | Status: AC
  Administered 2016-10-10: 7 [IU] via SUBCUTANEOUS
  Filled 2016-10-10: qty 1

## 2016-10-10 MED ORDER — FENTANYL CITRATE (PF) 100 MCG/2ML IJ SOLN
INTRAMUSCULAR | Status: DC | PRN
  Administered 2016-10-10: 50 ug via INTRAVENOUS

## 2016-10-10 MED ORDER — DONEPEZIL HCL 10 MG PO TABS
10.0000 mg | ORAL_TABLET | Freq: Every day | ORAL | Status: DC
  Administered 2016-10-10: 10 mg via ORAL
  Filled 2016-10-10: qty 1

## 2016-10-10 MED ORDER — SODIUM CHLORIDE 0.45 % IV SOLN
INTRAVENOUS | Status: DC
  Administered 2016-10-10 – 2016-10-11 (×2): via INTRAVENOUS

## 2016-10-10 MED ORDER — ENZALUTAMIDE 40 MG PO CAPS: 160.0000 mg | ORAL_CAPSULE | Freq: Every day | ORAL | Status: DC

## 2016-10-10 MED ORDER — FENTANYL CITRATE (PF) 100 MCG/2ML IJ SOLN
INTRAMUSCULAR | Status: AC
  Filled 2016-10-10: qty 2

## 2016-10-10 MED ORDER — BENAZEPRIL HCL 20 MG PO TABS
40.0000 mg | ORAL_TABLET | Freq: Every day | ORAL | Status: DC
  Administered 2016-10-10 – 2016-10-11 (×2): 40 mg via ORAL
  Filled 2016-10-10 (×2): qty 2

## 2016-10-10 MED ORDER — ONDANSETRON HCL 4 MG/2ML IJ SOLN
INTRAMUSCULAR | Status: AC
  Filled 2016-10-10: qty 2

## 2016-10-10 MED ORDER — OXYCODONE HCL 5 MG/5ML PO SOLN
5.0000 mg | Freq: Once | ORAL | Status: DC | PRN
  Filled 2016-10-10: qty 5

## 2016-10-10 MED ORDER — SUCCINYLCHOLINE CHLORIDE 200 MG/10ML IV SOSY
PREFILLED_SYRINGE | INTRAVENOUS | Status: AC
  Filled 2016-10-10: qty 10

## 2016-10-10 MED ORDER — DEXTROSE 50 % IV SOLN
25.0000 mL | Freq: Once | INTRAVENOUS | Status: AC
  Administered 2016-10-10: 25 mL via INTRAVENOUS

## 2016-10-10 MED ORDER — BELLADONNA ALKALOIDS-OPIUM 16.2-60 MG RE SUPP
RECTAL | Status: AC
  Filled 2016-10-10: qty 1

## 2016-10-10 MED ORDER — SODIUM CHLORIDE 0.9 % IR SOLN
Status: DC | PRN
  Administered 2016-10-10: 27000 mL

## 2016-10-10 MED ORDER — 0.9 % SODIUM CHLORIDE (POUR BTL) OPTIME
TOPICAL | Status: DC | PRN
  Administered 2016-10-10: 1000 mL

## 2016-10-10 MED ORDER — METFORMIN HCL 500 MG PO TABS
500.0000 mg | ORAL_TABLET | Freq: Two times a day (BID) | ORAL | Status: DC
  Administered 2016-10-10 – 2016-10-11 (×2): 500 mg via ORAL
  Filled 2016-10-10 (×2): qty 1

## 2016-10-10 MED ORDER — ACETAMINOPHEN 10 MG/ML IV SOLN
1000.0000 mg | Freq: Four times a day (QID) | INTRAVENOUS | Status: DC
  Administered 2016-10-10 – 2016-10-11 (×3): 1000 mg via INTRAVENOUS
  Filled 2016-10-10 (×4): qty 100

## 2016-10-10 MED ORDER — OXYCODONE HCL 5 MG PO TABS: 5.0000 mg | ORAL_TABLET | Freq: Once | ORAL | Status: DC | PRN

## 2016-10-10 MED ORDER — HYDROMORPHONE HCL 1 MG/ML IJ SOLN
INTRAMUSCULAR | Status: AC
  Filled 2016-10-10: qty 1

## 2016-10-10 MED ORDER — PROMETHAZINE HCL 25 MG/ML IJ SOLN: 6.2500 mg | INTRAMUSCULAR | Status: DC | PRN

## 2016-10-10 MED ORDER — SUGAMMADEX SODIUM 200 MG/2ML IV SOLN
INTRAVENOUS | Status: AC
  Filled 2016-10-10: qty 2

## 2016-10-10 MED ORDER — ONDANSETRON HCL 4 MG/2ML IJ SOLN
INTRAMUSCULAR | Status: DC | PRN
  Administered 2016-10-10: 4 mg via INTRAVENOUS

## 2016-10-10 MED ORDER — NAPHAZOLINE-GLYCERIN 0.012-0.2 % OP SOLN
1.0000 [drp] | Freq: Four times a day (QID) | OPHTHALMIC | Status: DC | PRN
  Filled 2016-10-10 (×2): qty 15

## 2016-10-10 MED ORDER — ROCURONIUM BROMIDE 50 MG/5ML IV SOSY
PREFILLED_SYRINGE | INTRAVENOUS | Status: AC
  Filled 2016-10-10: qty 5

## 2016-10-10 MED ORDER — PROPOFOL 10 MG/ML IV BOLUS
INTRAVENOUS | Status: AC
  Filled 2016-10-10: qty 20

## 2016-10-10 MED ORDER — HYDROMORPHONE HCL 1 MG/ML IJ SOLN: 0.2500 mg | INTRAMUSCULAR | Status: DC | PRN

## 2016-10-10 MED ORDER — SUGAMMADEX SODIUM 200 MG/2ML IV SOLN
INTRAVENOUS | Status: DC | PRN
  Administered 2016-10-10: 200 mg via INTRAVENOUS

## 2016-10-10 MED ORDER — HYDRALAZINE HCL 20 MG/ML IJ SOLN: 5.0000 mg | INTRAMUSCULAR | Status: DC | PRN

## 2016-10-10 MED ORDER — BELLADONNA ALKALOIDS-OPIUM 16.2-60 MG RE SUPP
RECTAL | Status: DC | PRN
  Administered 2016-10-10: 1 via RECTAL

## 2016-10-10 MED ORDER — FUROSEMIDE 40 MG PO TABS
40.0000 mg | ORAL_TABLET | Freq: Every day | ORAL | Status: DC
  Administered 2016-10-10 – 2016-10-11 (×2): 40 mg via ORAL
  Filled 2016-10-10 (×2): qty 1

## 2016-10-10 MED ORDER — CIPROFLOXACIN IN D5W 400 MG/200ML IV SOLN
400.0000 mg | INTRAVENOUS | Status: AC
  Administered 2016-10-10: 400 mg via INTRAVENOUS
  Filled 2016-10-10: qty 200

## 2016-10-10 MED ORDER — LIDOCAINE 2% (20 MG/ML) 5 ML SYRINGE
INTRAMUSCULAR | Status: AC
  Filled 2016-10-10: qty 5

## 2016-10-10 MED ORDER — LACTATED RINGERS IV SOLN
INTRAVENOUS | Status: DC
  Administered 2016-10-10 (×2): via INTRAVENOUS

## 2016-10-10 MED ORDER — PROPOFOL 10 MG/ML IV BOLUS
INTRAVENOUS | Status: DC | PRN
  Administered 2016-10-10: 200 mg via INTRAVENOUS

## 2016-10-10 MED ORDER — DEXTROSE 50 % IV SOLN
INTRAVENOUS | Status: AC
  Filled 2016-10-10: qty 50

## 2016-10-10 MED ORDER — MEPERIDINE HCL 50 MG/ML IJ SOLN: 6.2500 mg | INTRAMUSCULAR | Status: DC | PRN

## 2016-10-10 MED ORDER — LIDOCAINE 2% (20 MG/ML) 5 ML SYRINGE
INTRAMUSCULAR | Status: DC | PRN
  Administered 2016-10-10: 10 mg via INTRAVENOUS

## 2016-10-10 MED ORDER — ACETAMINOPHEN 500 MG PO TABS: 1000.0000 mg | ORAL_TABLET | Freq: Four times a day (QID) | ORAL | Status: DC | PRN

## 2016-10-10 MED ORDER — AMLODIPINE BESYLATE 5 MG PO TABS
5.0000 mg | ORAL_TABLET | Freq: Every day | ORAL | Status: DC
  Administered 2016-10-11: 5 mg via ORAL
  Filled 2016-10-10: qty 1

## 2016-10-10 MED ORDER — HEPARIN SODIUM (PORCINE) 5000 UNIT/ML IJ SOLN
5000.0000 [IU] | Freq: Three times a day (TID) | INTRAMUSCULAR | Status: DC
  Administered 2016-10-10 – 2016-10-11 (×3): 5000 [IU] via SUBCUTANEOUS
  Filled 2016-10-10 (×3): qty 1

## 2016-10-10 SURGICAL SUPPLY — 20 items
BAG URINE DRAINAGE (UROLOGICAL SUPPLIES) ×3 IMPLANT
BAG URO CATCHER STRL LF (MISCELLANEOUS) ×3 IMPLANT
CATH FOLEY 2WAY SLVR 30CC 24FR (CATHETERS) ×3 IMPLANT
CATH FOLEY 3WAY 30CC 22FR (CATHETERS) ×2 IMPLANT
CATH FOLEY 3WAY 30CC 24FR (CATHETERS)
CATH URTH STD 24FR FL 3W 2 (CATHETERS) IMPLANT
COVER SURGICAL LIGHT HANDLE (MISCELLANEOUS) ×3 IMPLANT
ELECT REM PT RETURN 15FT ADLT (MISCELLANEOUS) ×3 IMPLANT
GLOVE BIO SURGEON STRL SZ7.5 (GLOVE) ×3 IMPLANT
GOWN STRL REUS W/ TWL XL LVL3 (GOWN DISPOSABLE) ×1 IMPLANT
GOWN STRL REUS W/TWL XL LVL3 (GOWN DISPOSABLE) ×6 IMPLANT
HOLDER FOLEY CATH W/STRAP (MISCELLANEOUS) IMPLANT
LOOP CUT BIPOLAR 24F LRG (ELECTROSURGICAL) ×3 IMPLANT
MANIFOLD NEPTUNE II (INSTRUMENTS) ×3 IMPLANT
PACK CYSTO (CUSTOM PROCEDURE TRAY) ×3 IMPLANT
SET ASPIRATION TUBING (TUBING) IMPLANT
SYR 30ML LL (SYRINGE) IMPLANT
SYRINGE IRR TOOMEY STRL 70CC (SYRINGE) ×3 IMPLANT
TUBING CONNECTING 10 (TUBING) ×2 IMPLANT
TUBING CONNECTING 10' (TUBING) ×1

## 2016-10-10 NOTE — Progress Notes (Signed)
Inpatient Diabetes Program Recommendations  AACE/ADA: New Consensus Statement on Inpatient Glycemic Control (2015)  Target Ranges:  Prepandial:   less than 140 mg/dL      Peak postprandial:   less than 180 mg/dL (1-2 hours)      Critically ill patients:  140 - 180 mg/dL   Review of Glycemic Control  Diabetes history: DM 2 Outpatient Diabetes medications: 75/25 insulin 20 units BID (Dose equivalent to 30 units basal, 10 units short acting), Metformin 500 mg BID Current orders for Inpatient glycemic control: IN OR  Inpatient Diabetes Program Recommendations:     Glucose levels elevated from 289-322 mg/dl in recent labs prior to surgical procedure this am.   Consider A1c level for glucose control over the past 2-3 months.  Consider Levemir 15 units (half of home dose equivalent), Novolog Moderate Correction tid + HS scale.  Thanks,  Tama Headings RN, MSN, [facility] Inpatient Diabetes Coordinator Team Pager [phone] (8a-5p)

## 2016-10-10 NOTE — Transfer of Care (Signed)
Immediate Anesthesia Transfer of Care Note  Patient: Carlos Schultz  Procedure(s) Performed: Procedure(s): CHANNEL TRANSURETHRAL RESECTION OF THE PROSTATE (TURP) (N/A)  Patient Location: PACU  Anesthesia Type:General  Level of Consciousness: sedated  Airway & Oxygen Therapy: Patient Spontanous Breathing and Patient connected to face mask oxygen  Post-op Assessment: Report given to RN and Post -op Vital signs reviewed and stable  Post vital signs: Reviewed and stable  Last Vitals:  Vitals:   10/10/16 0813  BP: (!) 144/61  Pulse: 89  Resp: 18  Temp: 36.7 C    Last Pain:  Vitals:   10/10/16 0813  TempSrc: Oral      Patients Stated Pain Goal: 4 (48/47/20 7218)  Complications: No apparent anesthesia complications

## 2016-10-10 NOTE — Op Note (Signed)
Preoperative diagnosis:  1. 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 Urinary frequency, urgency, and nocturia     Postoperative diagnosis:  3. 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 Urinary frequency, urgency, and nocturia   5. Constipation  Procedure: 1. Transurethral resection of prostate 2. Fecal disimpaction  Surgeon: Ardis Hughs, MD  Anesthesia: General  Complications: None  Intraoperative findings:  #1: The patient had a false passage in the bulbar urethra that is well-healed and blind-ending.  #2: The patient had a very high bladder neck with obstructing lateral lobes as well as obstructing median lobe. There was evidence of gross prostate cancer. Several of the radiation seed implants were unroofed and removed  EBL: 50 mL  Specimens: #1 prostate chips, there were also some old radioactive seed implants that were removed and included in the specimen.  Indication: Carlos Schultz is a 76 y.o. patient with recurrent locally invasive prostate cancer with associated bladder outlet obstruction causing urinary frequency, urgency, and nocturia..  After reviewing the management options for treatment, he elected to proceed with the above surgical procedure(s). We have discussed the potential benefits and risks of the procedure, side effects of the proposed treatment, the likelihood of the patient achieving the goals of the procedure, and any potential problems that might occur during the procedure or recuperation. Informed consent has been obtained.  Description of procedure:  The patient was taken to the operating room and general anesthesia was induced.  The patient was placed in the dorsal lithotomy position, prepped and draped in the usual sterile fashion, and preoperative antibiotics were administered. A preoperative time-out was performed.   A 30 21 French cystoscope was gently passed into the  patient's urethra and into the bladder under visual guidance. I then performed a cystoscopic evaluation noting orthotopic ureteral orifice easily and a heavily trabeculated bladder. There were no discrete nodules or abnormalities within the mucosa otherwise. I then removed the 21 French cystoscope and exchanged for a 26 resectoscope sheath which was passed into the bladder. The visual obturator. I then exchanged obturator for the resectoscope.  I then parked the scope at the verumontanum and worked on creating a groove at the 7 and 5:00 positions. I then subsequently removed the patient's right lateral lobe starting from 7:00 up until approximately 11:00. Once I was down to an adequate depth here I then turned my attention to the patient's left lateral lobe and resected from the 5:00 position up to the 1:00 position. I then removed the patient's median lobe. At this point I evacuated the patient's bladder chips. I had unroofed several radiation seeds that I also evacuated. I then worked on hemostasis. I then reinserted the scope had resected the anterior aspect of the prostate as this appeared to be also creating obstruction given the invasive nature of his cancer. Hemostasis was then worked on its be significantly improved. I then evacuated all the chips is with a piston syringe. They reevaluated everything worked on the patient's apex a little bit more, however taking care not to resect too deep into the apex and lead to urinary incontinence.  Once the prostatic obstruction was relieved satisfactorily and adequate hemostasis was achieved I again removed the remainder of the prostate chips. Subsequently removed the scope and passed a 22 French 30 mL 3-way Foley catheter into the patient's bladder. I opted to The catheter given that it was no ongoing bleeding rather than start the patient on continuous bladder irrigation.  I then placed a B&O suppository into the patient's rectum. I noted at this time that the  patient was significantly constipated with a large amount of hard stool balls within his rectum which I then promptly evacuated. Once a large volume of stool had been evacuated a B&O was repassed into the patient's rectum.  The patient was subsequently extubated and returned to the PACU in stable condition.  Ardis Hughs, M.D.

## 2016-10-10 NOTE — H&P (Signed)
f/u to monitor Prostate Cancer  HPI: Carlos Schultz is a 76 year-old male established patient who is here for interval evaluation of his prostate cancer.  The patient was last seen August 2017.   He was diagnosed with prostate cancer in 08/05/1995. The patient's gleason score was: 7. Pretreatment PSA: 16.2.   The patient's most recent PSA was 258. This was drawn on approximately 05/20/2016.   PSA History: 2008: 112 (ADT initiated), 2015: 25.8,7/17: 322, 12/17: 258, 3/18: 304.   The patient started/underwent treatemt on 1997. The patient was treated with brachytherapy. He received a total of 8 years months of ADT with his radiation.   He has biochemical recurrence of his prostate cancer. He underwent androgen deprivation therapy following the initial treatment of his prostate caner. The patient's last shot of ADT was 06/03/2016. Last bone scan: 8/7/1 : increased uptake 7th rib, 2015 - negative - 2008 - increased uptake in left 7th rib. Abdomen/Pelvic CT: 01/15/16 - local extension of CaP, bilateral enlarged external iliac LAD.   He does have problems with erections. Currently he is using none for his ED.   The patient is having urinary incontinence. He wears 1 pads per day to manage his incontinence. He has had new lower extremity edema. The patient has developed frequency, urgency, and incontinence.   Re-started on Lupron in August 2017 after being lost to follow-up. Metastatic survey showed enlarged iliac nodes and locally advanced disease.  12/17 - developed urinary retention, cath placed temporarily.   Interval: Returns for follow-up today, PSA rising on ADT. Patient has urinary frequency every 15 minutes. He complains of weak stream. He has severe nocturia as well as nocturnal enuresis. He denies any hematuria or dysuria. Denies any new bone or back pain.     ALLERGIES: No Allergies    MEDICATIONS: Metformin Hcl  Adult Aspirin Low Strength 81 MG TBDP Oral  Allopurinol 300 MG Oral  Tablet 0 Oral  Benazepril Hcl 20 mg tablet Oral  Bicalutamide 50 mg tablet 0 Oral  Calcium/Vitamin D/Minerals TABS Oral  Donepezil HCl - 10 MG Oral Tablet Oral  Fluticasone Propionate 50 mcg/actuation spray, suspension Nasal  Furosemide 40 mg tablet Oral  Glycopyrrolate 1 mg tablet Oral  Humalog  Iron TABS Oral  Lupron Depot 30 mg (4 month) syringe kit 30 mg IM Administered by: Louis Meckel, M.D.  Metoprolol Tartrate 100 mg tablet Oral  MiraLax Oral Powder Oral  Mucinex TB12 Oral  NovoLOG Mix 70/30 SUSP Subcutaneous  Simvastatin 40 mg tablet Oral  Tylenol TABS Oral  Vitamin B-1 100 MG Oral Tablet Oral     GU PSH: Locm 300-'399Mg'$ /Ml Iodine,1Ml - 01/15/2016    NON-GU PSH: None   GU PMH: Prostate Cancer, History - 05/27/2016, - 01/23/2016, - 01/02/2016, Prostate Cancer, - 2014 Incomplete bladder emptying (Acute), Foley placed. Will begin Rapaflo 8 mg 1 po daily and f/u as scheduled 05/27/16 w/Dr. 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 Rising PSA, Post Treatment - 01/02/2016 Prostate Cancer, Prostate cancer - 2015 ED, arterial insufficiency, Erectile dysfunction due to arterial insufficiency - 2014 Gross hematuria, Gross hematuria - 2014 Urinary Frequency, Increased urinary frequency - 2014 Urinary Tract Inf, Unspec site, Urinary tract infection - 2014      PMH Notes:  2006-10-21 13:13:34 - Note: Acute Myocardial Infarction  2006-10-21 13:13:34 - Note: Arthritis   NON-GU PMH: Encounter for general adult medical examination without abnormal findings, Encounter for preventive health examination - 2015 Glycosuria, Glycosuria - 2015 Personal history of  other diseases of the circulatory system, History of hypertension - 2014 Personal history of other endocrine, nutritional and metabolic disease, History of diabetes mellitus - 2014    FAMILY HISTORY: Blood In Urine - Runs In Family Diabetes - Runs In Family Family Health Status Number - Runs In  Family Prostate Cancer - Runs In Family   SOCIAL HISTORY: Marital Status: Widowed Current Smoking Status: Patient does not smoke anymore.     REVIEW OF SYSTEMS:    GU Review Male:   Patient reports frequent urination, get up at night to urinate, leakage of urine, and trouble starting your stream. Patient denies hard to postpone urination, burning/ pain with urination, stream starts and stops, have to strain to urinate , erection problems, and penile pain.  Gastrointestinal (Upper):   Patient denies nausea, vomiting, and indigestion/ heartburn.  Gastrointestinal (Lower):   Patient denies diarrhea and constipation.  Constitutional:   Patient denies fever, night sweats, weight loss, and fatigue.  Skin:   Patient denies skin rash/ lesion and itching.  Eyes:   Patient denies blurred vision and double vision.  Ears/ Nose/ Throat:   Patient denies sore throat and sinus problems.  Hematologic/Lymphatic:   Patient denies swollen glands and easy bruising.  Cardiovascular:   Patient denies leg swelling and chest pains.  Respiratory:   Patient denies cough and shortness of breath.  Endocrine:   Patient denies excessive thirst.  Musculoskeletal:   Patient denies back pain and joint pain.  Neurological:   Patient denies headaches and dizziness.  Psychologic:   Patient denies depression and anxiety.   VITAL SIGNS:      08/27/2016 08:46 AM  Weight 194 lb / 88 kg  Height 72 in / 182.88 cm  BP 132/73 mmHg  Pulse 89 /min  BMI 26.3 kg/m   PAST DATA REVIEWED:  Source Of History:  Patient   08/20/16 05/22/16 01/02/16 12/03/13 06/15/13 02/10/13 10/08/12 06/06/12  PSA  Total PSA 304.00 ng/dl 259.00 ng/dl 322.00  28.58  10.45  8.25  5.91  3.88     06/15/13 02/10/13 10/08/12 06/06/12 12/27/10  Hormones  Testosterone, Total 40  22  32  <10.00  12.58     PROCEDURES:         Flexible Cystoscopy - 52000  Risks, benefits, and some of the potential complications of the procedure were discussed at length  with the patient including infection, bleeding, voiding discomfort, urinary retention, fever, chills, sepsis, and others. All questions were answered. Informed consent was obtained. Antibiotic prophylaxis was given. Sterile technique and intraurethral analgesia were used.  Meatus:  Normal size. Normal location. Normal condition.  Urethra:  No strictures.  External Sphincter:  Normal.  Verumontanum:  Normal.  Prostate:  Severe hyperplasia. Non-obstructing.  Bladder Neck:  Non-obstructing.  Ureteral Orifices:  Normal location. Normal size. Normal shape. Effluxed clear urine.  Bladder:  No trabeculation. No tumors. Normal mucosa. No stones.      The lower urinary tract was carefully examined. The procedure was well-tolerated and without complications. Antibiotic instructions were given. Instructions were given to call the office immediately for bloody urine, difficulty urinating, urinary retention, painful or frequent urination, fever, chills, nausea, vomiting or other illness. The patient stated that he understood these instructions and would comply with them.         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 I suggested that we at this point start [place]. We'll work to get this set up.  3   Urinary Obstruction -  N13.8 The patient has severe urinary tract symptoms getting up every 15 or 20 minutes at nighttime. He has a very weak stream, he also has associated urinary incontinence. I suspect that he has prostate growing into his urethra which was clear on his cystoscopy. We discussed treatment options and I recommended he consider a channel TURP.     PLAN:           Orders Labs Urine Culture          Schedule Labs: 3 Months - PSA    3 Months - Urinalysis  Return Visit/Planned Activity: 3 Months          Document Letter(s):  Created for Patient: Clinical Summary         Notes:   the patient has prostatic obstruction causing severe voiding symptoms. I think the patient would benefit substantially from a TURP. I discussed this procedure with both the son and the patient and they both agreed to proceed. They understand that is one night in Hospital. We went over the risks and the benefits in significant detail. The patient will need preoperative clearance.   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 As such, we'll continue Lupron but also at [facility] to his regimen. I plan to see the patient back in approximately 3 months for his PSA check after starting Xtandi.

## 2016-10-10 NOTE — Anesthesia Procedure Notes (Signed)
Procedure Name: Intubation Date/Time: 10/10/2016 9:31 AM Performed by: Lind Covert Pre-anesthesia Checklist: Patient identified, Emergency Drugs available, Suction available, Patient being monitored and Timeout performed Patient Re-evaluated:Patient Re-evaluated prior to inductionOxygen Delivery Method: Circle system utilized Preoxygenation: Pre-oxygenation with 100% oxygen Intubation Type: IV induction Laryngoscope Size: Mac and 4 Grade View: Grade I Tube type: Oral Tube size: 7.5 mm Number of attempts: 1 Airway Equipment and Method: Stylet Placement Confirmation: ETT inserted through vocal cords under direct vision,  positive ETCO2 and breath sounds checked- equal and bilateral Secured at: 22 cm Tube secured with: Tape Dental Injury: Teeth and Oropharynx as per pre-operative assessment

## 2016-10-10 NOTE — Anesthesia Preprocedure Evaluation (Signed)
Anesthesia Evaluation  Patient identified by MRN, date of birth, ID band Patient awake    Reviewed: Allergy & Precautions, NPO status , Patient's Chart, lab work & pertinent test results  Airway Mallampati: II  TM Distance: >3 FB Neck ROM: Full    Dental no notable dental hx.    Pulmonary neg pulmonary ROS,    Pulmonary exam normal breath sounds clear to auscultation       Cardiovascular hypertension, Pt. on medications + Past MI  Normal cardiovascular exam Rhythm:Regular Rate:Normal     Neuro/Psych PSYCHIATRIC DISORDERS negative neurological ROS     GI/Hepatic negative GI ROS, Neg liver ROS,   Endo/Other  negative endocrine ROSdiabetes, Poorly Controlled, Type 2, Insulin Dependent, Oral Hypoglycemic Agents  Renal/GU Renal InsufficiencyRenal disease  negative genitourinary   Musculoskeletal negative musculoskeletal ROS (+)   Abdominal   Peds negative pediatric ROS (+)  Hematology negative hematology ROS (+) anemia ,   Anesthesia Other Findings   Reproductive/Obstetrics negative OB ROS                            Anesthesia Physical Anesthesia Plan  ASA: III  Anesthesia Plan: General   Post-op Pain Management:    Induction: Intravenous  Airway Management Planned: Oral ETT  Additional Equipment:   Intra-op Plan:   Post-operative Plan: Extubation in OR  Informed Consent: I have reviewed the patients History and Physical, chart, labs and discussed the procedure including the risks, benefits and alternatives for the proposed anesthesia with the patient or authorized representative who has indicated his/her understanding and acceptance.   Dental advisory given  Plan Discussed with: CRNA  Anesthesia Plan Comments:         Anesthesia Quick Evaluation

## 2016-10-10 NOTE — Anesthesia Postprocedure Evaluation (Signed)
Anesthesia Post Note  Patient: Carlos Schultz  Procedure(s) Performed: Procedure(s) (LRB): CHANNEL TRANSURETHRAL RESECTION OF THE PROSTATE (TURP) (N/A)  Patient location during evaluation: PACU Anesthesia Type: General Level of consciousness: sedated and patient cooperative Pain management: pain level controlled Vital Signs Assessment: post-procedure vital signs reviewed and stable Respiratory status: spontaneous breathing Cardiovascular status: stable Anesthetic complications: no       Last Vitals:  Vitals:   10/10/16 1200 10/10/16 1226  BP: (!) 173/89 (!) 170/69  Pulse: 84 91  Resp: 20 18  Temp: 36.6 C 36.6 C    Last Pain:  Vitals:   10/10/16 1200  TempSrc:   PainSc: 2                  Nolon Nations

## 2016-10-11 ENCOUNTER — Encounter (HOSPITAL_COMMUNITY): Payer: Self-pay | Admitting: Urology

## 2016-10-11 DIAGNOSIS — C61 Malignant neoplasm of prostate: Secondary | ICD-10-CM | POA: Diagnosis not present

## 2016-10-11 LAB — BASIC METABOLIC PANEL
Anion gap: 8 (ref 5–15)
BUN: 21 mg/dL — AB (ref 6–20)
CHLORIDE: 101 mmol/L (ref 101–111)
CO2: 27 mmol/L (ref 22–32)
Calcium: 9.2 mg/dL (ref 8.9–10.3)
Creatinine, Ser: 1.36 mg/dL — ABNORMAL HIGH (ref 0.61–1.24)
GFR calc Af Amer: 56 mL/min — ABNORMAL LOW (ref >60)
GFR calc non Af Amer: 49 mL/min — ABNORMAL LOW (ref >60)
Glucose, Bld: 118 mg/dL — ABNORMAL HIGH (ref 65–99)
Potassium: 4.6 mmol/L (ref 3.5–5.1)
Sodium: 136 mmol/L (ref 135–145)

## 2016-10-11 LAB — CBC
HCT: 26.5 % — ABNORMAL LOW (ref 39.0–52.0)
Hemoglobin: 8.3 g/dL — ABNORMAL LOW (ref 13.0–17.0)
MCH: 25.2 pg — AB (ref 26.0–34.0)
MCHC: 31.3 g/dL (ref 30.0–36.0)
MCV: 80.5 fL (ref 78.0–100.0)
PLATELETS: 250 10*3/uL (ref 150–400)
RBC: 3.29 MIL/uL — ABNORMAL LOW (ref 4.22–5.81)
RDW: 13.3 % (ref 11.5–15.5)
WBC: 6.2 10*3/uL (ref 4.0–10.5)

## 2016-10-11 LAB — GLUCOSE, CAPILLARY
Glucose-Capillary: 110 mg/dL — ABNORMAL HIGH (ref 65–99)
Glucose-Capillary: 202 mg/dL — ABNORMAL HIGH (ref 65–99)

## 2016-10-11 MED ORDER — ACETAMINOPHEN 500 MG PO TABS: 1000.0000 mg | ORAL_TABLET | Freq: Four times a day (QID) | ORAL | 0 refills | Status: DC | PRN

## 2016-10-11 MED ORDER — CIPROFLOXACIN HCL 500 MG PO TABS
500.0000 mg | ORAL_TABLET | Freq: Once | ORAL | Status: AC
  Administered 2016-10-11: 500 mg via ORAL
  Filled 2016-10-11: qty 1

## 2016-10-11 MED ORDER — DOCUSATE SODIUM 100 MG PO CAPS: 100.0000 mg | ORAL_CAPSULE | Freq: Two times a day (BID) | ORAL | 0 refills | Status: DC

## 2016-10-11 NOTE — Discharge Summary (Signed)
Date of admission: 10/10/2016  Date of discharge: 10/11/2016  Admission diagnosis: locally advanced prostate cancer, bladder outlet obstruction with urinary frequency  Discharge diagnosis: same, s/p TURP  Secondary diagnoses:  Patient Active Problem List   Diagnosis Date Noted  . Prostate cancer ([place]) 10/10/2016  . Closed fracture of phalanx or phalanges of hand 05/25/2014  . UTI (urinary tract infection) 05/25/2014  . Cognitive disorder   . Hypoglycemia 05/23/2014  . Pedal edema 05/23/2014  . URINARY INCONTINENCE, URGE 01/03/2009  . FATIGUE 02/22/2008  . Memory loss 12/22/2007  . RENAL INSUFFICIENCY, CHRONIC 09/22/2007  . DIABETES MELLITUS, TYPE II, ON INSULIN, UNCONTROLLED 09/10/2007  . ANEMIA 09/10/2007  . SYNCOPE 09/10/2007  . HYPERLIPIDEMIA 04/02/2007  . HEMATURIA, GROSS 04/02/2007  . ADENOCARCINOMA, PROSTATE, HX OF 06/11/1995    Procedures performed: Procedure(s): CHANNEL TRANSURETHRAL RESECTION OF THE PROSTATE (TURP)  History and Physical: For full details, please see admission history and physical. Briefly, Carlos Schultz is a 76 y.o. year old patient with Locally advanced prostate cancer with severe lower urinary tract symptoms. The patient opted for surgical intervention in attempt to relieve some of his obstructive symptoms. He presented a electively for a transurethral resection of his prostate.   Hospital Course: Patient tolerated the procedure well.  He was then transferred to the floor after an uneventful PACU stay.  His hospital course was uncomplicated.  On POD#1 he had met discharge criteria: was eating a regular diet, was up and ambulating independently,  pain was well controlled, was voiding without a catheter, and was ready to for discharge.  He was discharged home on over-the-counter Tylenol as well as Colace for his symptoms.   PE: NAD, alert/oriented x 2 Nonlabored breathing Abdomen was soft   Laboratory values:   Recent Labs  10/08/16 0954  10/11/16 0453  WBC 6.2 6.2  HGB 9.9* 8.3*  HCT 32.7* 26.5*    Recent Labs  10/08/16 0954 10/11/16 0453  NA 134* 136  K 5.3* 4.6  CL 99* 101  CO2 26 27  GLUCOSE 344* 118*  BUN 29* 21*  CREATININE 1.40* 1.36*  CALCIUM 9.9 9.2   No results for input(s): LABPT, INR in the last 72 hours. No results for input(s): LABURIN in the last 72 hours. Results for orders placed or performed during the hospital encounter of 10/08/16  Urine culture     Status: Abnormal   Collection Time: 10/08/16  9:54 AM  Result Value Ref Range Status   Specimen Description URINE, RANDOM  Final   Special Requests NONE  Final   Culture (A)  Final    60,000 COLONIES/mL STREPTOCOCCUS AGALACTIAE TESTING AGAINST S. AGALACTIAE NOT ROUTINELY PERFORMED DUE TO PREDICTABILITY OF AMP/PEN/VAN SUSCEPTIBILITY. Performed at [facility], [place] [address], [place], [place] [address]    Report Status 10/09/2016 FINAL  Final    Disposition: Home  Discharge instruction: The patient was instructed to be ambulatory but told to refrain from heavy lifting, strenuous activity, or driving.   Discharge medications: Allergies as of 10/11/2016   No Known Allergies     Medication List    TAKE these medications   acetaminophen 500 MG tablet Commonly known as:  TYLENOL Take 2 tablets (1,000 mg total) by mouth every 6 (six) hours as needed for mild pain.   amLODipine 5 MG tablet Commonly known as:  NORVASC Take 5 mg by mouth daily.   benazepril 40 MG tablet Commonly known as:  LOTENSIN Take 40 mg by mouth daily.  CALCIUM CITRATE PO Take 1 tablet by mouth daily.   docusate sodium 100 MG capsule Commonly known as:  COLACE Take 1 capsule (100 mg total) by mouth 2 (two) times daily.   donepezil 10 MG tablet Commonly known as:  ARICEPT Take 10 mg by mouth at bedtime.   furosemide 40 MG tablet Commonly known as:  LASIX Take 40 mg by mouth daily.   insulin lispro protamine-lispro (75-25) 100 UNIT/ML Susp  injection Commonly known as:  HUMALOG 75/25 MIX Inject 20 Units into the skin 2 (two) times daily.   metFORMIN 500 MG tablet Commonly known as:  GLUCOPHAGE Take 500 mg by mouth 2 (two) times daily.   multivitamin with minerals Tabs tablet Take 1 tablet by mouth daily.   OMEGA 3 PO Take 1 capsule by mouth daily.   tetrahydrozoline 0.05 % ophthalmic solution Place 1-2 drops into both eyes 3 (three) times daily as needed (for eye irritation/discomfort.).   XTANDI 40 MG capsule Generic drug:  enzalutamide Take 160 mg by mouth daily after breakfast.       Followup:  Follow-up Information    Karen Kays, NP Follow up on 10/24/2016.   Specialty:  Nurse Practitioner Why:  at 10:30 am for post-operative check Contact information: [address] [place] [address] [phone]

## 2016-10-11 NOTE — Discharge Instructions (Signed)

## 2016-11-03 IMAGING — DX DG LUMBAR SPINE 2-3V
3 series · 3 of 3 positions shown · non-contrast
Comparison: None.

CLINICAL DATA: Low back pain after fall.

EXAM:
LUMBAR SPINE - 2-3 VIEW

[l-spine ap]
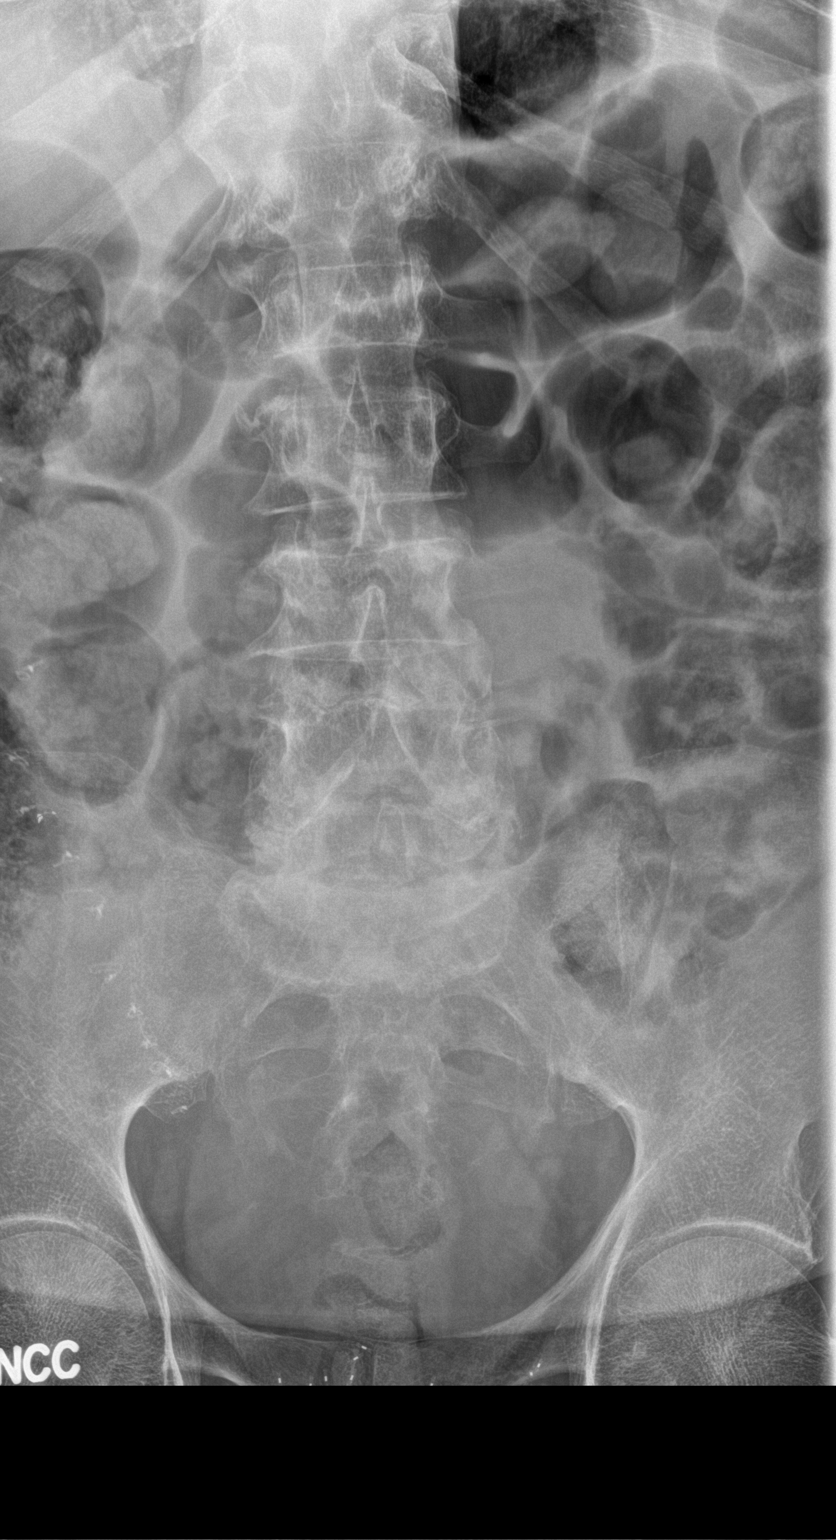

[l-spine lat]
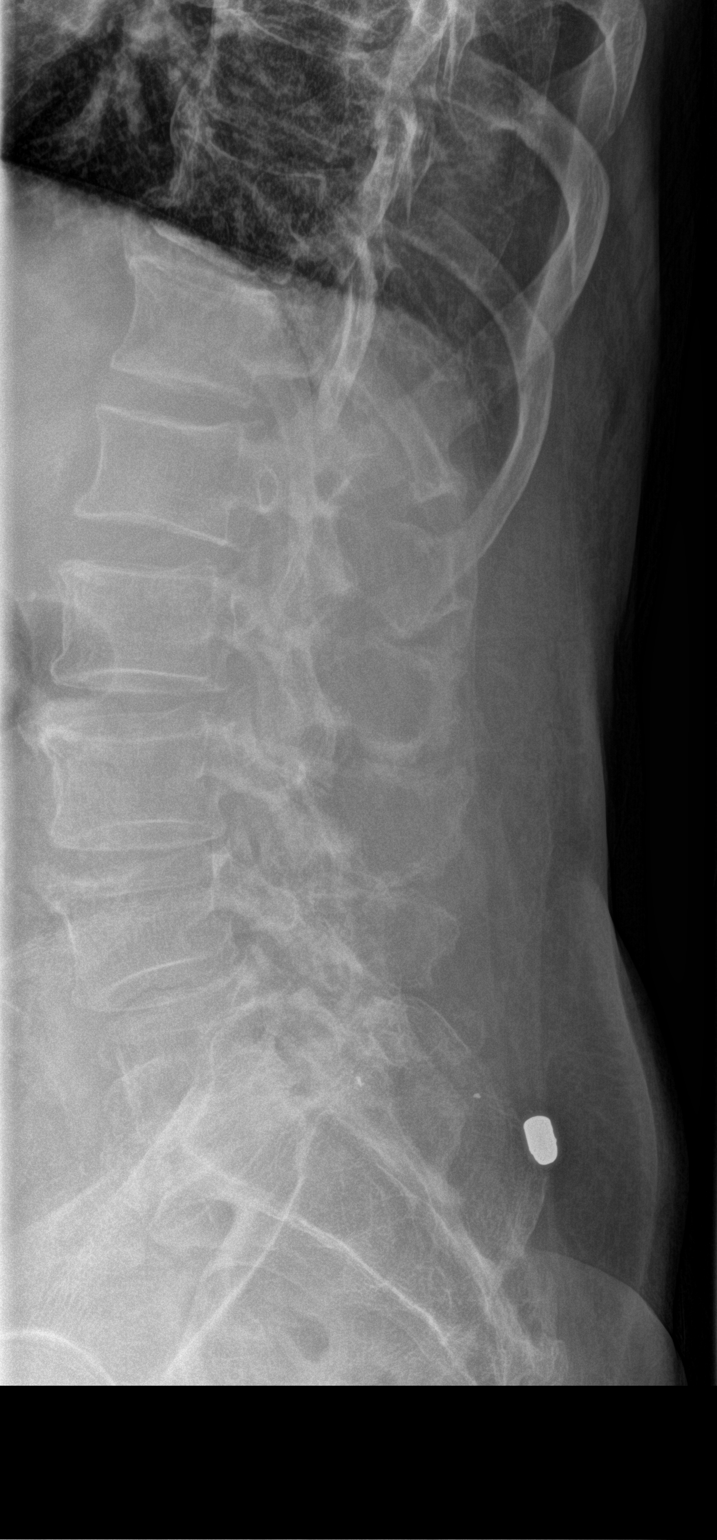

[l-spine spot]
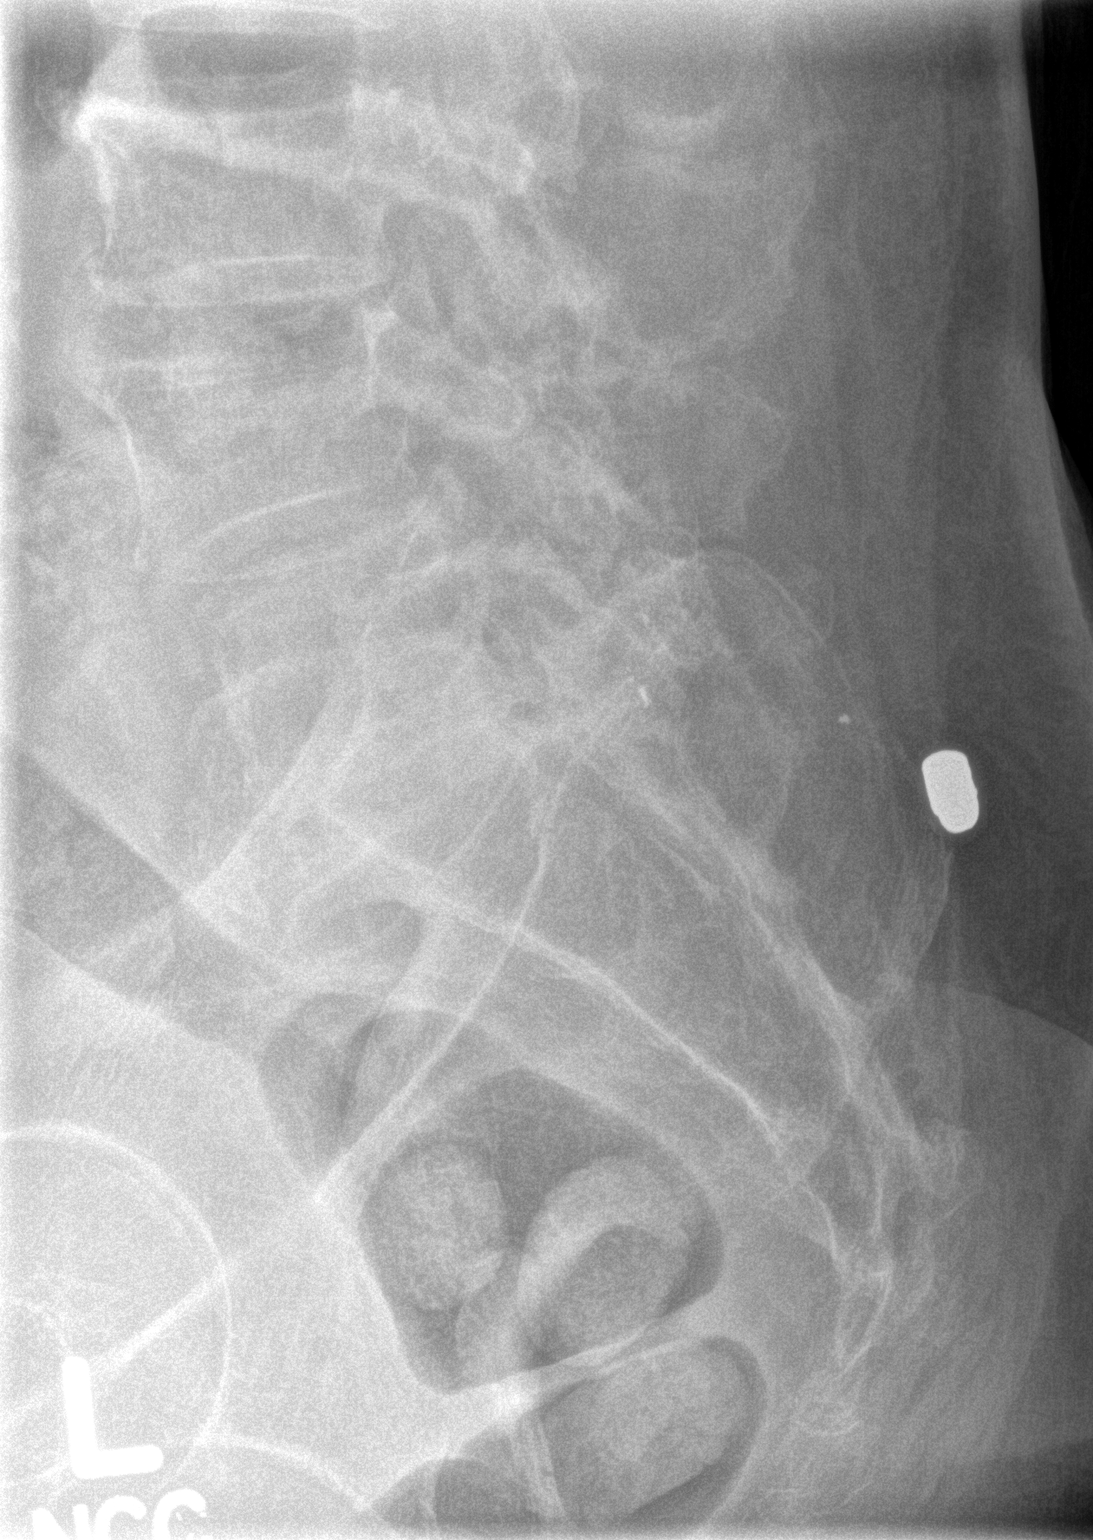

[3 of 3 positions shown; findings below may reference images not displayed]

FINDINGS: Diffuse osteopenia is noted. Degenerative disc disease is noted at
L2-3 and L3-4 with anterior osteophyte formation. No significant
spondylolisthesis is noted. Mildly depressed superior endplate of L3
and L4 vertebral bodies are noted which most likely are chronic.
Moderate degenerative disc disease is noted at L4-5 and L5-S1.
Hypertrophy of posterior facet joints is seen at L3-4 and L4-5
secondary to degenerative joint disease.
IMPRESSION: Degenerative changes as described above. Mild superior endplate
depression of L3 and L4 vertebral bodies is noted which most likely
is chronic, but if patient is symptomatic in this area, CT scan may
be performed for further evaluation.

## 2017-04-10 DIAGNOSIS — S72001A Fracture of unspecified part of neck of right femur, initial encounter for closed fracture: Secondary | ICD-10-CM

## 2017-04-10 HISTORY — DX: Fracture of unspecified part of neck of right femur, initial encounter for closed fracture: S72.001A

## 2017-04-13 ENCOUNTER — Emergency Department (HOSPITAL_COMMUNITY): Payer: Medicare Other

## 2017-04-13 ENCOUNTER — Inpatient Hospital Stay (HOSPITAL_COMMUNITY)
Admission: EM | Admit: 2017-04-13 | Discharge: 2017-04-17 | DRG: 689 | Disposition: A | Discharge status: Skilled Nursing Facility | Payer: Medicare Other | Attending: Internal Medicine | Admitting: Internal Medicine

## 2017-04-13 DIAGNOSIS — Z79891 Long term (current) use of opiate analgesic: Secondary | ICD-10-CM

## 2017-04-13 DIAGNOSIS — N3000 Acute cystitis without hematuria: Secondary | ICD-10-CM

## 2017-04-13 DIAGNOSIS — Z794 Long term (current) use of insulin: Secondary | ICD-10-CM

## 2017-04-13 DIAGNOSIS — N390 Urinary tract infection, site not specified: Principal | ICD-10-CM | POA: Diagnosis present

## 2017-04-13 DIAGNOSIS — G9341 Metabolic encephalopathy: Secondary | ICD-10-CM | POA: Diagnosis present

## 2017-04-13 DIAGNOSIS — N183 Chronic kidney disease, stage 3 unspecified: Secondary | ICD-10-CM | POA: Diagnosis present

## 2017-04-13 DIAGNOSIS — E785 Hyperlipidemia, unspecified: Secondary | ICD-10-CM | POA: Diagnosis present

## 2017-04-13 DIAGNOSIS — I252 Old myocardial infarction: Secondary | ICD-10-CM

## 2017-04-13 DIAGNOSIS — R269 Unspecified abnormalities of gait and mobility: Secondary | ICD-10-CM | POA: Diagnosis present

## 2017-04-13 DIAGNOSIS — R410 Disorientation, unspecified: Secondary | ICD-10-CM

## 2017-04-13 DIAGNOSIS — K760 Fatty (change of) liver, not elsewhere classified: Secondary | ICD-10-CM | POA: Diagnosis present

## 2017-04-13 DIAGNOSIS — B964 Proteus (mirabilis) (morganii) as the cause of diseases classified elsewhere: Secondary | ICD-10-CM | POA: Diagnosis present

## 2017-04-13 DIAGNOSIS — M109 Gout, unspecified: Secondary | ICD-10-CM | POA: Diagnosis present

## 2017-04-13 DIAGNOSIS — I129 Hypertensive chronic kidney disease with stage 1 through stage 4 chronic kidney disease, or unspecified chronic kidney disease: Secondary | ICD-10-CM | POA: Diagnosis present

## 2017-04-13 DIAGNOSIS — Z8546 Personal history of malignant neoplasm of prostate: Secondary | ICD-10-CM

## 2017-04-13 DIAGNOSIS — I10 Essential (primary) hypertension: Secondary | ICD-10-CM | POA: Diagnosis present

## 2017-04-13 DIAGNOSIS — R945 Abnormal results of liver function studies: Secondary | ICD-10-CM

## 2017-04-13 DIAGNOSIS — F0390 Unspecified dementia without behavioral disturbance: Secondary | ICD-10-CM | POA: Diagnosis present

## 2017-04-13 DIAGNOSIS — R7989 Other specified abnormal findings of blood chemistry: Secondary | ICD-10-CM | POA: Diagnosis present

## 2017-04-13 DIAGNOSIS — E1129 Type 2 diabetes mellitus with other diabetic kidney complication: Secondary | ICD-10-CM | POA: Diagnosis present

## 2017-04-13 DIAGNOSIS — E1122 Type 2 diabetes mellitus with diabetic chronic kidney disease: Secondary | ICD-10-CM | POA: Diagnosis present

## 2017-04-13 DIAGNOSIS — D649 Anemia, unspecified: Secondary | ICD-10-CM | POA: Diagnosis present

## 2017-04-13 DIAGNOSIS — E11649 Type 2 diabetes mellitus with hypoglycemia without coma: Secondary | ICD-10-CM | POA: Diagnosis present

## 2017-04-13 DIAGNOSIS — I2510 Atherosclerotic heart disease of native coronary artery without angina pectoris: Secondary | ICD-10-CM | POA: Diagnosis present

## 2017-04-13 DIAGNOSIS — Z79899 Other long term (current) drug therapy: Secondary | ICD-10-CM

## 2017-04-13 LAB — COMPREHENSIVE METABOLIC PANEL
ALBUMIN: 3.1 g/dL — AB (ref 3.5–5.0)
ALT: 1122 U/L — AB (ref 17–63)
AST: 171 U/L — AB (ref 15–41)
Alkaline Phosphatase: 80 U/L (ref 38–126)
Anion gap: 13 (ref 5–15)
BUN: 21 mg/dL — AB (ref 6–20)
CHLORIDE: 96 mmol/L — AB (ref 101–111)
CO2: 26 mmol/L (ref 22–32)
Calcium: 9.6 mg/dL (ref 8.9–10.3)
Creatinine, Ser: 1.39 mg/dL — ABNORMAL HIGH (ref 0.61–1.24)
GFR calc Af Amer: 55 mL/min — ABNORMAL LOW (ref >60)
GFR calc non Af Amer: 47 mL/min — ABNORMAL LOW (ref >60)
GLUCOSE: 263 mg/dL — AB (ref 65–99)
POTASSIUM: 4.3 mmol/L (ref 3.5–5.1)
Sodium: 135 mmol/L (ref 135–145)
TOTAL PROTEIN: 6.5 g/dL (ref 6.5–8.1)
Total Bilirubin: 1.6 mg/dL — ABNORMAL HIGH (ref 0.3–1.2)

## 2017-04-13 LAB — DIFFERENTIAL
BASOS ABS: 0 10*3/uL (ref 0.0–0.1)
BASOS PCT: 0 %
EOS ABS: 0.1 10*3/uL (ref 0.0–0.7)
Eosinophils Relative: 2 %
Lymphocytes Relative: 26 %
Lymphs Abs: 1.7 10*3/uL (ref 0.7–4.0)
MONO ABS: 0.8 10*3/uL (ref 0.1–1.0)
MONOS PCT: 13 %
Neutro Abs: 4 10*3/uL (ref 1.7–7.7)
Neutrophils Relative %: 59 %

## 2017-04-13 LAB — I-STAT TROPONIN, ED: TROPONIN I, POC: 0.02 ng/mL (ref 0.00–0.08)

## 2017-04-13 LAB — URINALYSIS, ROUTINE W REFLEX MICROSCOPIC
Bilirubin Urine: NEGATIVE
HGB URINE DIPSTICK: NEGATIVE
Ketones, ur: NEGATIVE mg/dL
NITRITE: POSITIVE — AB
Protein, ur: 30 mg/dL — AB
SPECIFIC GRAVITY, URINE: 1.014 (ref 1.005–1.030)
SQUAMOUS EPITHELIAL / LPF: NONE SEEN
pH: 7 (ref 5.0–8.0)

## 2017-04-13 LAB — PROTIME-INR
INR: 1.04
PROTHROMBIN TIME: 13.6 s (ref 11.4–15.2)

## 2017-04-13 LAB — RAPID URINE DRUG SCREEN, HOSP PERFORMED
Amphetamines: NOT DETECTED
Barbiturates: NOT DETECTED
Benzodiazepines: NOT DETECTED
Cocaine: NOT DETECTED
Opiates: NOT DETECTED
Tetrahydrocannabinol: NOT DETECTED

## 2017-04-13 LAB — CBC
HEMATOCRIT: 30.1 % — AB (ref 39.0–52.0)
HEMOGLOBIN: 9.9 g/dL — AB (ref 13.0–17.0)
MCH: 27.7 pg (ref 26.0–34.0)
MCHC: 32.9 g/dL (ref 30.0–36.0)
MCV: 84.1 fL (ref 78.0–100.0)
Platelets: 274 10*3/uL (ref 150–400)
RBC: 3.58 MIL/uL — ABNORMAL LOW (ref 4.22–5.81)
RDW: 14.4 % (ref 11.5–15.5)
WBC: 6.7 10*3/uL (ref 4.0–10.5)

## 2017-04-13 LAB — APTT: aPTT: 25 seconds (ref 24–36)

## 2017-04-13 LAB — ETHANOL: Alcohol, Ethyl (B): <10 mg/dL (ref <10)

## 2017-04-13 LAB — AMMONIA: Ammonia: 47 umol/L — ABNORMAL HIGH (ref 9–35)

## 2017-04-13 LAB — LIPASE, BLOOD: Lipase: 18 U/L (ref 11–51)

## 2017-04-13 MED ORDER — DEXTROSE 5 % IV SOLN
1.0000 g | Freq: Once | INTRAVENOUS | Status: AC
  Administered 2017-04-13: 1 g via INTRAVENOUS
  Filled 2017-04-13: qty 10

## 2017-04-13 NOTE — ED Triage Notes (Signed)
Pt BIB EMS from home for increased weakness and confusion; LSN yesterday morning. Pt baseline dementia, per ems typically walks and can hold a conversation and has been unable to do as well since yesterday AM. Pt alert and oriented to self, unable to follow commands. No arm weakness noted.  Resp e/u. NAD at this time.

## 2017-04-13 NOTE — ED Notes (Signed)
Telesitter set up at pt bedside.

## 2017-04-13 NOTE — ED Notes (Signed)
Attempted PIV x 3. Second RN to attempt.

## 2017-04-13 NOTE — ED Notes (Signed)
Phleb at bedside drawing labs. 

## 2017-04-13 NOTE — ED Notes (Signed)
Patient transported to CT 
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
## 2017-04-13 NOTE — ED Notes (Signed)
Portable xray at bedside.
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
## 2017-04-13 NOTE — ED Notes (Signed)
Pt PIV not drawing back for labs. Phleb consulted to come draw labs.

## 2017-04-13 NOTE — ED Notes (Signed)
Patient transported to Ultrasound 

## 2017-04-14 DIAGNOSIS — I2510 Atherosclerotic heart disease of native coronary artery without angina pectoris: Secondary | ICD-10-CM | POA: Diagnosis present

## 2017-04-14 DIAGNOSIS — N390 Urinary tract infection, site not specified: Principal | ICD-10-CM

## 2017-04-14 DIAGNOSIS — Z79891 Long term (current) use of opiate analgesic: Secondary | ICD-10-CM | POA: Diagnosis not present

## 2017-04-14 DIAGNOSIS — N183 Chronic kidney disease, stage 3 (moderate): Secondary | ICD-10-CM | POA: Diagnosis present

## 2017-04-14 DIAGNOSIS — I252 Old myocardial infarction: Secondary | ICD-10-CM | POA: Diagnosis not present

## 2017-04-14 DIAGNOSIS — R410 Disorientation, unspecified: Secondary | ICD-10-CM | POA: Diagnosis present

## 2017-04-14 DIAGNOSIS — I129 Hypertensive chronic kidney disease with stage 1 through stage 4 chronic kidney disease, or unspecified chronic kidney disease: Secondary | ICD-10-CM | POA: Diagnosis present

## 2017-04-14 DIAGNOSIS — Z794 Long term (current) use of insulin: Secondary | ICD-10-CM | POA: Diagnosis not present

## 2017-04-14 DIAGNOSIS — N3000 Acute cystitis without hematuria: Secondary | ICD-10-CM | POA: Diagnosis not present

## 2017-04-14 DIAGNOSIS — R945 Abnormal results of liver function studies: Secondary | ICD-10-CM

## 2017-04-14 DIAGNOSIS — E785 Hyperlipidemia, unspecified: Secondary | ICD-10-CM | POA: Diagnosis present

## 2017-04-14 DIAGNOSIS — N182 Chronic kidney disease, stage 2 (mild): Secondary | ICD-10-CM | POA: Diagnosis not present

## 2017-04-14 DIAGNOSIS — R262 Difficulty in walking, not elsewhere classified: Secondary | ICD-10-CM | POA: Diagnosis not present

## 2017-04-14 DIAGNOSIS — D649 Anemia, unspecified: Secondary | ICD-10-CM | POA: Diagnosis not present

## 2017-04-14 DIAGNOSIS — I10 Essential (primary) hypertension: Secondary | ICD-10-CM | POA: Diagnosis not present

## 2017-04-14 DIAGNOSIS — G9341 Metabolic encephalopathy: Secondary | ICD-10-CM

## 2017-04-14 DIAGNOSIS — Z79899 Other long term (current) drug therapy: Secondary | ICD-10-CM | POA: Diagnosis not present

## 2017-04-14 DIAGNOSIS — K760 Fatty (change of) liver, not elsewhere classified: Secondary | ICD-10-CM | POA: Diagnosis present

## 2017-04-14 DIAGNOSIS — Z8546 Personal history of malignant neoplasm of prostate: Secondary | ICD-10-CM | POA: Diagnosis not present

## 2017-04-14 DIAGNOSIS — F0390 Unspecified dementia without behavioral disturbance: Secondary | ICD-10-CM | POA: Diagnosis present

## 2017-04-14 DIAGNOSIS — E11649 Type 2 diabetes mellitus with hypoglycemia without coma: Secondary | ICD-10-CM | POA: Diagnosis present

## 2017-04-14 DIAGNOSIS — N184 Chronic kidney disease, stage 4 (severe): Secondary | ICD-10-CM | POA: Diagnosis not present

## 2017-04-14 DIAGNOSIS — E1122 Type 2 diabetes mellitus with diabetic chronic kidney disease: Secondary | ICD-10-CM | POA: Diagnosis present

## 2017-04-14 DIAGNOSIS — R7989 Other specified abnormal findings of blood chemistry: Secondary | ICD-10-CM | POA: Diagnosis present

## 2017-04-14 DIAGNOSIS — M109 Gout, unspecified: Secondary | ICD-10-CM | POA: Diagnosis present

## 2017-04-14 DIAGNOSIS — R269 Unspecified abnormalities of gait and mobility: Secondary | ICD-10-CM | POA: Diagnosis present

## 2017-04-14 DIAGNOSIS — B964 Proteus (mirabilis) (morganii) as the cause of diseases classified elsewhere: Secondary | ICD-10-CM | POA: Diagnosis present

## 2017-04-14 LAB — GLUCOSE, CAPILLARY
GLUCOSE-CAPILLARY: 99 mg/dL (ref 65–99)
Glucose-Capillary: 187 mg/dL — ABNORMAL HIGH (ref 65–99)
Glucose-Capillary: 49 mg/dL — ABNORMAL LOW (ref 65–99)
Glucose-Capillary: 136 mg/dL — ABNORMAL HIGH (ref 65–99)
Glucose-Capillary: 67 mg/dL (ref 65–99)
Glucose-Capillary: 126 mg/dL — ABNORMAL HIGH (ref 65–99)

## 2017-04-14 LAB — LACTIC ACID, PLASMA
LACTIC ACID, VENOUS: 1.8 mmol/L (ref 0.5–1.9)
Lactic Acid, Venous: 1.7 mmol/L (ref 0.5–1.9)

## 2017-04-14 LAB — ACETAMINOPHEN LEVEL
Acetaminophen (Tylenol), Serum: <10 ug/mL — ABNORMAL LOW (ref 10–30)
Acetaminophen (Tylenol), Serum: <10 ug/mL — ABNORMAL LOW (ref 10–30)

## 2017-04-14 LAB — TROPONIN I: Troponin I: <0.03 ng/mL (ref <0.03)

## 2017-04-14 LAB — PROCALCITONIN: PROCALCITONIN: 2.09 ng/mL

## 2017-04-14 MED ORDER — INSULIN ASPART 100 UNIT/ML ~~LOC~~ SOLN
0.0000 [IU] | Freq: Three times a day (TID) | SUBCUTANEOUS | Status: DC
  Administered 2017-04-15 (×2): 2 [IU] via SUBCUTANEOUS
  Administered 2017-04-16: 1 [IU] via SUBCUTANEOUS
  Administered 2017-04-16 – 2017-04-17 (×2): 2 [IU] via SUBCUTANEOUS

## 2017-04-14 MED ORDER — DEXTROSE-NACL 5-0.45 % IV SOLN
INTRAVENOUS | Status: DC
  Administered 2017-04-14 – 2017-04-15 (×2): via INTRAVENOUS

## 2017-04-14 MED ORDER — HYDRALAZINE HCL 20 MG/ML IJ SOLN: 5.0000 mg | INTRAMUSCULAR | Status: DC | PRN

## 2017-04-14 MED ORDER — LACTULOSE ENEMA
300.0000 mL | Freq: Two times a day (BID) | ORAL | Status: DC
  Administered 2017-04-14 – 2017-04-17 (×7): 300 mL via RECTAL
  Filled 2017-04-14 (×9): qty 300

## 2017-04-14 MED ORDER — ENOXAPARIN SODIUM 40 MG/0.4ML ~~LOC~~ SOLN
40.0000 mg | SUBCUTANEOUS | Status: DC
Start: 2017-04-14 — End: 2017-04-17
  Administered 2017-04-14 – 2017-04-17 (×4): 40 mg via SUBCUTANEOUS
  Filled 2017-04-14 (×5): qty 0.4

## 2017-04-14 MED ORDER — ONDANSETRON HCL 4 MG PO TABS: 4.0000 mg | ORAL_TABLET | Freq: Four times a day (QID) | ORAL | Status: DC | PRN

## 2017-04-14 MED ORDER — CEFTRIAXONE SODIUM 1 G IJ SOLR
1.0000 g | INTRAMUSCULAR | Status: DC
  Administered 2017-04-14 – 2017-04-16 (×3): 1 g via INTRAVENOUS
  Filled 2017-04-14 (×4): qty 10

## 2017-04-14 MED ORDER — DEXTROSE 50 % IV SOLN
INTRAVENOUS | Status: AC
  Administered 2017-04-14: 50 mL
  Filled 2017-04-14: qty 50

## 2017-04-14 MED ORDER — INSULIN GLARGINE 100 UNIT/ML ~~LOC~~ SOLN
7.0000 [IU] | Freq: Every day | SUBCUTANEOUS | Status: DC
  Administered 2017-04-14: 7 [IU] via SUBCUTANEOUS
  Filled 2017-04-14 (×2): qty 0.07

## 2017-04-14 MED ORDER — SODIUM CHLORIDE 0.9 % IV SOLN
INTRAVENOUS | Status: DC
  Administered 2017-04-14 (×2): via INTRAVENOUS

## 2017-04-14 MED ORDER — ONDANSETRON HCL 4 MG/2ML IJ SOLN: 4.0000 mg | Freq: Four times a day (QID) | INTRAMUSCULAR | Status: DC | PRN

## 2017-04-14 NOTE — Progress Notes (Signed)
Pt blood glucose 49, currently NPO.  Amp D50 IVP given per hypoglycemic protocol, MD text paged critical value, no new orders received,  pt resting in bed awake and alert, verbally denies pain, will continue to monitor, sister of pt at bedside.

## 2017-04-14 NOTE — Progress Notes (Signed)
POCT BS 136 post amp D50, pt resting in bed awake, no complaints offered, will continue to monitor.

## 2017-04-14 NOTE — Progress Notes (Signed)
Pt tolerated thin liquid and soft peaches, his dentures are with family, per MD may have carb modified diet.

## 2017-04-14 NOTE — ED Notes (Signed)
Attempted to call report

## 2017-04-14 NOTE — Progress Notes (Signed)
PROGRESS NOTE  Carlos Schultz UEA:[id number] DOB: 1941-02-12 DOA: 04/13/2017 PCP: Benito Mccreedy, MD  HPI/Recap of past 24 hours: Pt was seen and examined. No family members at bedside. Pt is alert but confused. Unable to obtain a ROS due to altered mental status in the setting of advanced dementia.  Assessment/Plan: Principal Problem:   UTI (urinary tract infection) Active Problems:   Type II diabetes mellitus with renal manifestations (HCC)   ANEMIA   Essential hypertension   Elevated liver function tests   Acute metabolic encephalopathy   CKD (chronic kidney disease), stage III ([place])   Code Status: Full  Family Communication: No family members at bedside  Disposition Plan: Will stay another midnight due to persistent altrered mental status.   Consultants:  None  Procedures:  None  Antimicrobials:  ceftriaxone  DVT prophylaxis:  lovenox 40 mg sq daily   Objective: Vitals:   04/14/17 0115 04/14/17 0130 04/14/17 0229 04/14/17 0530  BP: 115/66 (!) 106/57 134/60 124/72  Pulse: 70 71 (!) 47 84  Resp: 16 (!) 9 18 17   Temp:    98.7 F (37.1 C)  TempSrc:    Oral  SpO2: 100% 100% 94%   Weight:   64.2 kg (141 lb 8 oz)     Intake/Output Summary (Last 24 hours) at 04/14/2017 0825 Last data filed at 04/14/2017 4782 Gross per 24 hour  Intake 312.5 ml  Output 120 ml  Net 192.5 ml   Filed Weights   04/14/17 0229  Weight: 64.2 kg (141 lb 8 oz)    Exam:   General:  76 yo AAM WD WN in NAD   Cardiovascular: RRR with no murmurs rubs or gallops  Respiratory: CTA with no wheezes or rales  Abdomen: Soft NT ND with NBS x 4 quadrants  Musculoskeletal: No noted atrophy  Skin: Appears intact on limited skin exam  Psychiatry: Alert but confused.   Data Reviewed: CBC: Recent Labs  Lab 04/13/17 1645  WBC 6.7  NEUTROABS 4.0  HGB 9.9*  HCT 30.1*  MCV 84.1  PLT 956   Basic Metabolic Panel: Recent Labs  Lab 04/13/17 1645  NA 135  K 4.3  CL  96*  CO2 26  GLUCOSE 263*  BUN 21*  CREATININE 1.39*  CALCIUM 9.6   GFR: CrCl cannot be calculated (Unknown ideal weight.). Liver Function Tests: Recent Labs  Lab 04/13/17 1645  AST 171*  ALT 1,122*  ALKPHOS 80  BILITOT 1.6*  PROT 6.5  ALBUMIN 3.1*   Recent Labs  Lab 04/13/17 1921  LIPASE 18   Recent Labs  Lab 04/13/17 1921  AMMONIA 47*   Coagulation Profile: Recent Labs  Lab 04/13/17 1645  INR 1.04   Cardiac Enzymes: Recent Labs  Lab 04/14/17 0041  TROPONINI <0.03   BNP (last 3 results) No results for input(s): PROBNP in the last 8760 hours. HbA1C: No results for input(s): HGBA1C in the last 72 hours. CBG: Recent Labs  Lab 04/14/17 0226 04/14/17 0804  GLUCAP 187* 99   Lipid Profile: No results for input(s): CHOL, HDL, LDLCALC, TRIG, CHOLHDL, LDLDIRECT in the last 72 hours. Thyroid Function Tests: No results for input(s): TSH, T4TOTAL, FREET4, T3FREE, THYROIDAB in the last 72 hours. Anemia Panel: No results for input(s): VITAMINB12, FOLATE, FERRITIN, TIBC, IRON, RETICCTPCT in the last 72 hours. Urine analysis:    Component Value Date/Time   COLORURINE YELLOW 04/13/2017 2038   APPEARANCEUR HAZY (A) 04/13/2017 2038   LABSPEC 1.014 04/13/2017 2038   PHURINE  7.0 04/13/2017 2038   GLUCOSEU >=500 (A) 04/13/2017 2038   HGBUR NEGATIVE 04/13/2017 2038   BILIRUBINUR NEGATIVE 04/13/2017 2038   KETONESUR NEGATIVE 04/13/2017 2038   PROTEINUR 30 (A) 04/13/2017 2038   UROBILINOGEN 0.2 05/24/2014 1200   NITRITE POSITIVE (A) 04/13/2017 2038   LEUKOCYTESUR LARGE (A) 04/13/2017 2038   Sepsis Labs: @LABRCNTIP (procalcitonin:4,lacticidven:4)  )No results found for this or any previous visit (from the past 240 hour(s)).    Studies: Ct Head Wo Contrast  Result Date: 04/13/2017 CLINICAL DATA:  Increased weakness and confusion; LSN yesterday morning. Pt baseline dementia, per ems typically walks and can hold a conversation and has been unable to do as well  since yesterday AM) EXAM: CT HEAD WITHOUT CONTRAST TECHNIQUE: Contiguous axial images were obtained from the base of the skull through the vertex without intravenous contrast. COMPARISON:  05/23/2014 FINDINGS: Brain: No evidence of acute infarction, hemorrhage, hydrocephalus, extra-axial collection or mass lesion/mass effect. There is age appropriate ventricular and sulcal enlargement. Periventricular white matter hypoattenuation is noted consistent with mild chronic microvascular ischemic change. Vascular: No hyperdense vessel or unexpected calcification. Skull: Normal. Negative for fracture or focal lesion. Sinuses/Orbits: Globes and orbits are unremarkable. Visualized sinuses and mastoid air cells are clear. Other: None. IMPRESSION: 1. No acute intracranial abnormalities. 2. Age related volume loss. Mild chronic microvascular ischemic change. Electronically Signed   By: Lajean Manes M.D.   On: 04/13/2017 18:39   US Abdomen Complete  Result Date: 04/13/2017 CLINICAL DATA:  Elevated LFTs EXAM: ABDOMEN ULTRASOUND COMPLETE COMPARISON:  CT 01/15/2016, ultrasound 12/16/2009 FINDINGS: Gallbladder: Limited visibility due to bowel gas. No gallstones or wall thickening visualized. No sonographic Murphy sign noted by sonographer. Common bile duct: Diameter: 3.7 mm Liver: Increased echogenicity suggesting fatty change. Heterogeneous echotexture. Portal vein is patent on color Doppler imaging with normal direction of blood flow towards the liver. IVC: No abnormality visualized. Pancreas: Not seen due to bowel gas Spleen: Size and appearance within normal limits. Right Kidney: Length: 8.1 cm. Echogenicity within normal limits. No mass or hydronephrosis visualized. Left Kidney: Length: 9.6 cm. Echogenicity within normal limits. No mass or hydronephrosis visualized. Abdominal aorta: Not well seen due to bowel gas. Other findings: None. IMPRESSION: 1. The study is limited by copious bowel gas with limited visibility of the  pancreas, gallbladder, and aorta. 2. Negative for gallstones or biliary dilatation. 3. Heterogeneous echogenic liver suggesting fatty infiltration Electronically Signed   By: Donavan Foil M.D.   On: 04/13/2017 22:48   Dg Chest Portable 1 View  Result Date: 04/13/2017 CLINICAL DATA:  Confusion and weakness EXAM: PORTABLE CHEST 1 VIEW COMPARISON:  05/23/2014 FINDINGS: No acute pulmonary infiltrate, consolidation or pleural effusion is visualized. The cardiomediastinal silhouette is stable. Aortic atherosclerosis. No pneumothorax. IMPRESSION: No active disease. Electronically Signed   By: Donavan Foil M.D.   On: 04/13/2017 17:58    Scheduled Meds: . enoxaparin (LOVENOX) injection  40 mg Subcutaneous Q24H  . insulin aspart  0-9 Units Subcutaneous TID WC  . insulin glargine  7 Units Subcutaneous Daily  . lactulose  300 mL Rectal BID    Continuous Infusions: . sodium chloride 75 mL/hr at 04/14/17 0240  . cefTRIAXone (ROCEPHIN)  IV       LOS: 0 days   Acute metabolic encephalopathy:  -Likely due to UTI. -Frequent neuro check -Rocephin for UTI -lactulose to lower the ammonia level- 47.   UTI, present on admission -urine culture -IV rocephin -CBC, chemistry panel in am  -Admitted to telemetry  bed as inpatient -IV Rocephin -Follow-up blood culture and urine culture   Elevated liver function tests:  - AST 1122, ALT 171.  - Abdominal U/S revealed hetergeneous echogenic liver qith suspected fatty liver.Etiology is not clear. -Check hepatitis panel  Type II diabetes mellitus with renal manifestations ([place]): Last A1c 9.9 on 05/24/14, poorly fairly well controled. Patient is taking Amaryl and the 75/25 mix insulin at home -Lantus 7 units daily -SSI, A!C  Normocytic ANEMIA:  -Hemoglobin stable. -Continue follow-up by CBC  Essential hypertension: -IVF hydralazine when necessary -NPO until altered mental status has improved  CKD (chronic kidney disease), stage III ([place]):Marland Kitchen  Stable. Baseline creatinine 1.3-1.4. His creatinine is 1.39, BUN 21 -BMP     Kayleen Memos, MD [facility] Pager [phone]  If 7PM-7AM, please contact night-coverage [email or web address] Password [email or web address] 04/14/2017, 8:25 AM

## 2017-04-14 NOTE — Progress Notes (Signed)
New order received for D5/ 0.45% NS at 42mls/hr by MD and initiated, (see EMAR.)

## 2017-04-15 DIAGNOSIS — R262 Difficulty in walking, not elsewhere classified: Secondary | ICD-10-CM

## 2017-04-15 DIAGNOSIS — N184 Chronic kidney disease, stage 4 (severe): Secondary | ICD-10-CM

## 2017-04-15 DIAGNOSIS — E1122 Type 2 diabetes mellitus with diabetic chronic kidney disease: Secondary | ICD-10-CM

## 2017-04-15 LAB — BASIC METABOLIC PANEL
ANION GAP: 7 (ref 5–15)
BUN: 12 mg/dL (ref 6–20)
CALCIUM: 9.1 mg/dL (ref 8.9–10.3)
CO2: 24 mmol/L (ref 22–32)
CREATININE: 0.98 mg/dL (ref 0.61–1.24)
Chloride: 104 mmol/L (ref 101–111)
Glucose, Bld: 174 mg/dL — ABNORMAL HIGH (ref 65–99)
Potassium: 3.8 mmol/L (ref 3.5–5.1)
SODIUM: 135 mmol/L (ref 135–145)

## 2017-04-15 LAB — CBC
HEMATOCRIT: 29 % — AB (ref 39.0–52.0)
Hemoglobin: 9.4 g/dL — ABNORMAL LOW (ref 13.0–17.0)
MCH: 27.7 pg (ref 26.0–34.0)
MCHC: 32.4 g/dL (ref 30.0–36.0)
MCV: 85.5 fL (ref 78.0–100.0)
PLATELETS: 233 10*3/uL (ref 150–400)
RBC: 3.39 MIL/uL — ABNORMAL LOW (ref 4.22–5.81)
RDW: 15.1 % (ref 11.5–15.5)
WBC: 6.3 10*3/uL (ref 4.0–10.5)

## 2017-04-15 LAB — GLUCOSE, CAPILLARY
GLUCOSE-CAPILLARY: 153 mg/dL — AB (ref 65–99)
GLUCOSE-CAPILLARY: 187 mg/dL — AB (ref 65–99)
Glucose-Capillary: 115 mg/dL — ABNORMAL HIGH (ref 65–99)
Glucose-Capillary: 149 mg/dL — ABNORMAL HIGH (ref 65–99)

## 2017-04-15 NOTE — Progress Notes (Signed)
PROGRESS NOTE  Carlos Schultz XNA:[id number] DOB: [birth year] DOA: 04/13/2017 PCP: Benito Mccreedy, MD  HPI/Recap of past 24 hours: Episodes of hypoglycemia with no insulin use. D51/2NS.  Pt seen and examined. He is alert but confused. Unable to obtain a review of systems. Spoke with his sister who is receptive to SNF placement.   Assessment/Plan: Principal Problem:   UTI (urinary tract infection) Active Problems:   Type II diabetes mellitus with renal manifestations (HCC)   ANEMIA   Essential hypertension   Elevated liver function tests   Acute metabolic encephalopathy   CKD (chronic kidney disease), stage III ([place])  Code Status: Full  Family Communication: No family members at bedside  Disposition Plan: Will stay another midnight due to persistent altrered mental status.   Consultants:  None  Procedures:  None  Antimicrobials:  IV ceftriaxone   DVT prophylaxis:  lovenox 40 mg sq daily   Objective: Vitals:   04/14/17 1438 04/14/17 2252 04/15/17 0500 04/15/17 0615  BP: (!) 122/56 132/80  (!) 146/75  Pulse: 75 84  85  Resp:  17  17  Temp: 97.9 F (36.6 C) 98.6 F (37 C)  98.4 F (36.9 C)  TempSrc: Oral Oral  Oral  SpO2: 100% 100%  100%  Weight:   64.2 kg (141 lb 8 oz)     Intake/Output Summary (Last 24 hours) at 04/15/2017 0831 Last data filed at 04/15/2017 5427 Gross per 24 hour  Intake 1841.25 ml  Output 700 ml  Net 1141.25 ml   Filed Weights   04/14/17 0229 04/15/17 0500  Weight: 64.2 kg (141 lb 8 oz) 64.2 kg (141 lb 8 oz)    Exam:   General:  76 yo AAM WD WN in NAD   Cardiovascular: RRR with no murmurs rubs or gallops  Respiratory: CTA with no wheezes or rales  Abdomen: Soft NT ND with NBS x 4 quadrants  Musculoskeletal: No noted atrophy  Skin: Appears intact on limited skin exam  Psychiatry: Alert but confused.   Data Reviewed: CBC: Recent Labs  Lab 04/13/17 1645 04/15/17 0630  WBC 6.7 6.3  NEUTROABS 4.0   --   HGB 9.9* 9.4*  HCT 30.1* 29.0*  MCV 84.1 85.5  PLT 274 062   Basic Metabolic Panel: Recent Labs  Lab 04/13/17 1645 04/15/17 0630  NA 135 135  K 4.3 3.8  CL 96* 104  CO2 26 24  GLUCOSE 263* 174*  BUN 21* 12  CREATININE 1.39* 0.98  CALCIUM 9.6 9.1   GFR: CrCl cannot be calculated (Unknown ideal weight.). Liver Function Tests: Recent Labs  Lab 04/13/17 1645  AST 171*  ALT 1,122*  ALKPHOS 80  BILITOT 1.6*  PROT 6.5  ALBUMIN 3.1*   Recent Labs  Lab 04/13/17 1921  LIPASE 18   Recent Labs  Lab 04/13/17 1921  AMMONIA 47*   Coagulation Profile: Recent Labs  Lab 04/13/17 1645  INR 1.04   Cardiac Enzymes: Recent Labs  Lab 04/14/17 0041 04/14/17 0627 04/14/17 1116  TROPONINI <0.03 <0.03 <0.03   BNP (last 3 results) No results for input(s): PROBNP in the last 8760 hours. HbA1C: No results for input(s): HGBA1C in the last 72 hours. CBG: Recent Labs  Lab 04/14/17 1145 04/14/17 1232 04/14/17 1711 04/14/17 2249 04/15/17 0756  GLUCAP 49* 136* 67 126* 153*   Lipid Profile: No results for input(s): CHOL, HDL, LDLCALC, TRIG, CHOLHDL, LDLDIRECT in the last 72 hours. Thyroid Function Tests: No results for input(s): TSH, T4TOTAL, FREET4,  T3FREE, THYROIDAB in the last 72 hours. Anemia Panel: No results for input(s): VITAMINB12, FOLATE, FERRITIN, TIBC, IRON, RETICCTPCT in the last 72 hours. Urine analysis:    Component Value Date/Time   COLORURINE YELLOW 04/13/2017 2038   APPEARANCEUR HAZY (A) 04/13/2017 2038   LABSPEC 1.014 04/13/2017 2038   PHURINE 7.0 04/13/2017 2038   GLUCOSEU >=500 (A) 04/13/2017 2038   HGBUR NEGATIVE 04/13/2017 2038   BILIRUBINUR NEGATIVE 04/13/2017 2038   Roy 04/13/2017 2038   PROTEINUR 30 (A) 04/13/2017 2038   UROBILINOGEN 0.2 05/24/2014 1200   NITRITE POSITIVE (A) 04/13/2017 2038   LEUKOCYTESUR LARGE (A) 04/13/2017 2038   Sepsis Labs: @LABRCNTIP (procalcitonin:4,lacticidven:4)  )No results found for  this or any previous visit (from the past 240 hour(s)).    Studies: No results found.  Scheduled Meds: . enoxaparin (LOVENOX) injection  40 mg Subcutaneous Q24H  . insulin aspart  0-9 Units Subcutaneous TID WC  . lactulose  300 mL Rectal BID    Continuous Infusions: . sodium chloride Stopped (04/14/17 1848)  . cefTRIAXone (ROCEPHIN)  IV Stopped (04/14/17 2252)  . dextrose 5 % and 0.45% NaCl 75 mL/hr at 04/14/17 1848     LOS: 1 day   Acute metabolic encephalopathy: -Likely due to UTI. -Rocephin for UTI -lactulose to lower the ammonia level- 47 -repeat ammonia level   Proteus mirabilis UTI, present on admission -IV rocephin day#2 -CBC, chemistry panel in am  Elevated liver function tests: - AST 1122, ALT 171.  - Abdominal U/S revealed hetergeneous echogenic liver qith suspected fatty liver.Etiology is not clear. -Check hepatitis panel  Type II diabetes mellitus with renal manifestations (HCC):Last A1c9.9 on 05/24/14, poorly fairly well controled. Patient is takingAmaryl and the 75/25 mix insulinat home -Lantus 7 units daily -SSI, A!C  Normocytic ANEMIA: -Hemoglobin stable. -CBC am  Essential hypertension: -Prn IV hydralazine sbp>180 or dbp>105 -NPO until passes swallow evaluation  CKD (chronic kidney disease), stage III ([place]):Marland Kitchen Stable. Baseline creatinine 1.3-1.4. His creatinine is 1.39, BUN 21 -BMP  Ambulatory dysfunction -PT to evaluate and treat  Kayleen Memos, MD [facility] Pager [phone]  If 7PM-7AM, please contact night-coverage [email or web address] Password [email or web address] 04/15/2017, 8:31 AM

## 2017-04-16 LAB — URINE CULTURE

## 2017-04-16 LAB — GLUCOSE, CAPILLARY
GLUCOSE-CAPILLARY: 143 mg/dL — AB (ref 65–99)
GLUCOSE-CAPILLARY: 184 mg/dL — AB (ref 65–99)
GLUCOSE-CAPILLARY: 95 mg/dL (ref 65–99)
GLUCOSE-CAPILLARY: 242 mg/dL — AB (ref 65–99)

## 2017-04-16 NOTE — Progress Notes (Signed)
Left voicemail for son to do assessment. [facility] should have a bed tomorrow.  Percell Locus Navi Ewton LCSWA [phone]

## 2017-04-16 NOTE — Progress Notes (Signed)
Occupational Therapy Evaluation Patient Details Name: Carlos Schultz MRN: [id number] DOB: 1940-09-08 Today's Date: 04/16/2017    History of Present Illness Pt is a 76 y/o with PMH of HTN, CKD, DM< CAD, prostate Ca, dementia, and gout admitted with AMS found to have UTI.    Clinical Impression   Unsure of PLOF. Pt appears to be hallucinating at times during assessment. Oriented to self only. Max A with mobility and ADL at this time and will benefit from rehab at [facility]. Will follow acutely to facilitate DC to next venue of care.     Follow Up Recommendations  SNF    Equipment Recommendations  Other (comment)(TBA)    Recommendations for Other Services       Precautions / Restrictions Precautions Precautions: Fall Restrictions Weight Bearing Restrictions: No      Mobility Bed Mobility Overal bed mobility: Needs Assistance Bed Mobility: Supine to Sit     Supine to sit: Max assist Sit to supine: Mod assist   General bed mobility comments: requires assist to initiate and complete movement  Transfers Overall transfer level: Needs assistance   Transfers: Sit to/from Stand Sit to Stand: Mod assist         General transfer comment: pt appeared too confused, apparently hallucinating and transfer not attempted    Balance Overall balance assessment: Needs assistance Sitting-balance support: Bilateral upper extremity supported;Single extremity supported Sitting balance-Leahy Scale: Poor                                 ADL either performed or assessed with clinical judgement   ADL Overall ADL's : Needs assistance/impaired                                     Functional mobility during ADLs: Maximal assistance General ADL Comments: Pt ableot wipe mouth but unable to follow commands to complete other ADL tasks; would most althoughlikely do better at time appropriate for activity being asked to do     Vision         Perception     Praxis       Pertinent Vitals/Pain Pain Assessment: Faces Faces Pain Scale: No hurt     Hand Dominance Right   Extremity/Trunk Assessment Upper Extremity Assessment Upper Extremity Assessment: Generalized weakness   Lower Extremity Assessment Lower Extremity Assessment: Generalized weakness   Cervical / Trunk Assessment Cervical / Trunk Assessment: Kyphotic   Communication Communication Communication: Expressive difficulties;Receptive difficulties   Cognition Arousal/Alertness: Awake/alert Behavior During Therapy: Impulsive;Restless Overall Cognitive Status: No family/caregiver present to determine baseline cognitive functioning Area of Impairment: Memory;Attention;Orientation;Following commands;Safety/judgement;Awareness;Problem solving                 Orientation Level: Disoriented to;Place;Time;Situation Current Attention Level: Focused   Following Commands: Follows one step commands inconsistently Safety/Judgement: Decreased awareness of safety;Decreased awareness of deficits Awareness: Intellectual Problem Solving: Slow processing;Decreased initiation;Difficulty sequencing;Requires verbal cues;Requires tactile cues General Comments: pt oriented to self only; appears to be hallucinating   General Comments       Exercises     Shoulder Instructions      Home Living Family/patient expects to be discharged to:: Skilled nursing facility Living Arrangements: Alone  Additional Comments: per chart admitted fromhome; unable to give history      Prior Functioning/Environment Level of Independence: (unclear, pt poor historian and no family at bedside)        Comments: per chart walks and able to hold conversation        OT Problem List: Decreased activity tolerance;Impaired balance (sitting and/or standing);Decreased cognition;Decreased safety awareness;Decreased knowledge of use of DME or AE      OT  Treatment/Interventions: Self-care/ADL training;DME and/or AE instruction;Therapeutic activities;Patient/family education    OT Goals(Current goals can be found in the care plan section) Acute Rehab OT Goals Patient Stated Goal: none OT Goal Formulation: Patient unable to participate in goal setting Potential to Achieve Goals: Fair  OT Frequency: Min 2X/week   Barriers to D/C:            Co-evaluation              AM-PAC PT "6 Clicks" Daily Activity     Outcome Measure Help from another person eating meals?: A Little Help from another person taking care of personal grooming?: A Lot Help from another person toileting, which includes using toliet, bedpan, or urinal?: A Lot Help from another person bathing (including washing, rinsing, drying)?: Total Help from another person to put on and taking off regular upper body clothing?: Total Help from another person to put on and taking off regular lower body clothing?: Total 6 Click Score: 10   End of Session Nurse Communication: Other (comment)(daughter wanting update)  Activity Tolerance: Other (comment)(limited by cognitive status) Patient left: in bed;with call bell/phone within reach;with bed alarm set;with restraints reapplied  OT Visit Diagnosis: Unsteadiness on feet (R26.81);Muscle weakness (generalized) (M62.81);Other symptoms and signs involving cognitive function                Time: 1549-1609 OT Time Calculation (min): 20 min Charges:  OT General Charges $OT Visit: 1 Visit OT Evaluation $OT Eval Moderate Complexity: 1 Mod G-Codes:     [facility], OT/L  [phone] 04/16/2017  Rocky Rishel,HILLARY 04/16/2017, 5:12 PM

## 2017-04-16 NOTE — Progress Notes (Signed)
Patient is alert, verbal and confused.  Patient is uncooperative and  occassionally combative during care.  Lactulose enema given this shift, patient medium size loose stools.   Slept well through the night, D5 1/2NS infusing @75ml /hr. No acute distress noted.

## 2017-04-16 NOTE — Progress Notes (Signed)
PROGRESS NOTE  Carlos Schultz:[id number] DOB: 07-Feb-1941 DOA: 04/13/2017 PCP: Benito Mccreedy, MD  HPI/Recap of past 24 hours: The patient was seen and examined at his bedside. He is alert but is confused in the setting of advanced dementia. Repeats words and does not answer questions appropriately. Denies having any pain.   Assessment/Plan: Principal Problem:   UTI (urinary tract infection) Active Problems:   Type II diabetes mellitus with renal manifestations (HCC)   ANEMIA   Essential hypertension   Elevated liver function tests   Acute metabolic encephalopathy   CKD (chronic kidney disease), stage III ([place])  Code Status:Full  Family Communication:No family members at bedside; the Probation officer spoke with the patient's sister on the phone yesterday 04/15/17. Unable to reach the son after multiple attempts.  Disposition Plan:Will stay another midnight due to persistent altrered mental status in the setting of dementia and UTI being treated.   Consultants:  None  Procedures:  None  Antimicrobials:  IV ceftriaxone   DVT prophylaxis: lovenox 40 mg sq daily    Objective: Vitals:   04/15/17 2121 04/16/17 0458 04/16/17 0700 04/16/17 1326  BP: 136/72 139/87  127/65  Pulse: 74 72  79  Resp:  20  16  Temp: 98.5 F (36.9 C) 97.8 F (36.6 C)  98.6 F (37 C)  TempSrc: Oral Oral  Oral  SpO2: 100% 100%  100%  Weight:   57.2 kg (126 lb 3.2 oz)     Intake/Output Summary (Last 24 hours) at 04/16/2017 1517 Last data filed at 04/16/2017 1502 Gross per 24 hour  Intake 2412.5 ml  Output 300 ml  Net 2112.5 ml   Filed Weights   04/14/17 0229 04/15/17 0500 04/16/17 0700  Weight: 64.2 kg (141 lb 8 oz) 64.2 kg (141 lb 8 oz) 57.2 kg (126 lb 3.2 oz)    Exam:   General:76 yo AAM WD WN in NAD  Cardiovascular:RRR with no murmurs rubs or gallops  Respiratory:CTA with no wheezes or rales  Abdomen:Soft NT ND with NBS x 4  quadrants  Musculoskeletal:No noted atrophy  Skin:Appears intact on limited skin exam  Psychiatry:Alert but confused.   Data Reviewed: CBC: Recent Labs  Lab 04/13/17 1645 04/15/17 0630  WBC 6.7 6.3  NEUTROABS 4.0  --   HGB 9.9* 9.4*  HCT 30.1* 29.0*  MCV 84.1 85.5  PLT 274 626   Basic Metabolic Panel: Recent Labs  Lab 04/13/17 1645 04/15/17 0630  NA 135 135  K 4.3 3.8  CL 96* 104  CO2 26 24  GLUCOSE 263* 174*  BUN 21* 12  CREATININE 1.39* 0.98  CALCIUM 9.6 9.1   GFR: CrCl cannot be calculated (Unknown ideal weight.). Liver Function Tests: Recent Labs  Lab 04/13/17 1645  AST 171*  ALT 1,122*  ALKPHOS 80  BILITOT 1.6*  PROT 6.5  ALBUMIN 3.1*   Recent Labs  Lab 04/13/17 1921  LIPASE 18   Recent Labs  Lab 04/13/17 1921  AMMONIA 47*   Coagulation Profile: Recent Labs  Lab 04/13/17 1645  INR 1.04   Cardiac Enzymes: Recent Labs  Lab 04/14/17 0041 04/14/17 0627 04/14/17 1116  TROPONINI <0.03 <0.03 <0.03   BNP (last 3 results) No results for input(s): PROBNP in the last 8760 hours. HbA1C: No results for input(s): HGBA1C in the last 72 hours. CBG: Recent Labs  Lab 04/15/17 1219 04/15/17 1721 04/15/17 2220 04/16/17 0807 04/16/17 1156  GLUCAP 115* 187* 149* 143* 184*   Lipid Profile: No results for input(s):  CHOL, HDL, LDLCALC, TRIG, CHOLHDL, LDLDIRECT in the last 72 hours. Thyroid Function Tests: No results for input(s): TSH, T4TOTAL, FREET4, T3FREE, THYROIDAB in the last 72 hours. Anemia Panel: No results for input(s): VITAMINB12, FOLATE, FERRITIN, TIBC, IRON, RETICCTPCT in the last 72 hours. Urine analysis:    Component Value Date/Time   COLORURINE YELLOW 04/13/2017 2038   APPEARANCEUR HAZY (A) 04/13/2017 2038   LABSPEC 1.014 04/13/2017 2038   PHURINE 7.0 04/13/2017 2038   GLUCOSEU >=500 (A) 04/13/2017 2038   HGBUR NEGATIVE 04/13/2017 2038   BILIRUBINUR NEGATIVE 04/13/2017 2038   KETONESUR NEGATIVE 04/13/2017 2038    PROTEINUR 30 (A) 04/13/2017 2038   UROBILINOGEN 0.2 05/24/2014 1200   NITRITE POSITIVE (A) 04/13/2017 2038   LEUKOCYTESUR LARGE (A) 04/13/2017 2038   Sepsis Labs: @LABRCNTIP (procalcitonin:4,lacticidven:4)  ) Recent Results (from the past 240 hour(s))  Culture, blood (x 2)     Status: None (Preliminary result)   Collection Time: 04/13/17  7:25 PM  Result Value Ref Range Status   Specimen Description BLOOD RIGHT HAND  Final   Special Requests IN PEDIATRIC BOTTLE Blood Culture adequate volume  Final   Culture NO GROWTH 2 DAYS  Final   Report Status PENDING  Incomplete  Urine culture     Status: Abnormal   Collection Time: 04/13/17  8:39 PM  Result Value Ref Range Status   Specimen Description URINE, RANDOM  Final   Special Requests NONE  Final   Culture >=100,000 COLONIES/mL PROTEUS MIRABILIS (A)  Final   Report Status 04/16/2017 FINAL  Final   Organism ID, Bacteria PROTEUS MIRABILIS (A)  Final      Susceptibility   Proteus mirabilis - MIC*    AMPICILLIN <=2 SENSITIVE Sensitive     CEFAZOLIN <=4 SENSITIVE Sensitive     CEFTRIAXONE <=1 SENSITIVE Sensitive     CIPROFLOXACIN <=0.25 SENSITIVE Sensitive     GENTAMICIN <=1 SENSITIVE Sensitive     IMIPENEM 2 SENSITIVE Sensitive     NITROFURANTOIN 128 RESISTANT Resistant     TRIMETH/SULFA <=20 SENSITIVE Sensitive     AMPICILLIN/SULBACTAM <=2 SENSITIVE Sensitive     PIP/TAZO <=4 SENSITIVE Sensitive     * >=100,000 COLONIES/mL PROTEUS MIRABILIS  Culture, blood (x 2)     Status: None (Preliminary result)   Collection Time: 04/14/17 12:45 AM  Result Value Ref Range Status   Specimen Description BLOOD RIGHT HAND  Final   Special Requests IN PEDIATRIC BOTTLE Blood Culture adequate volume  Final   Culture NO GROWTH 2 DAYS  Final   Report Status PENDING  Incomplete      Studies: No results found.  Scheduled Meds: . enoxaparin (LOVENOX) injection  40 mg Subcutaneous Q24H  . insulin aspart  0-9 Units Subcutaneous TID WC  . lactulose   300 mL Rectal BID    Continuous Infusions: . sodium chloride Stopped (04/14/17 1848)  . cefTRIAXone (ROCEPHIN)  IV Stopped (04/15/17 2222)  . dextrose 5 % and 0.45% NaCl 75 mL/hr at 04/15/17 2328     LOS: 2 days   Acute metabolic encephalopathy: -improving -Likely due to UTI. -Rocephin for UTI, day#3 -continue lactulose to lower the ammonia level -repeat ammonia level   Proteus mirabilis UTI, present on admission -IV rocephin day#3 -CBC, chemistry panel in am -blood cx no growth 2 days  Elevated liver function tests: - AST 1122, ALT 171.  - Abdominal U/S revealed hetergeneous echogenic liver with suspected fatty liver. Etiology is not clear. -Hepatitis panel  Type II diabetes mellitus with  renal manifestations (HCC):Last A1c9.9 on 05/24/14, poorly fairly well controled. Patient is takingAmaryl and the 75/25 mix insulinat home -Lantus 7 units daily -SSI, A!C  NormocyticANEMIA: -Hemoglobin stable. -CBC am  Essential hypertension: -Prn IV hydralazine sbp>180 or dbp>105 -NPO until passes swallow evaluation  CKD (chronic kidney disease), stage III ([place]):Marland Kitchen Stable. Baseline creatinine 1.3-1.4. His creatinine is 1.39, BUN 21 -BMP  Ambulatory dysfunction -PT to evaluate and treat  Discharge planning: will attempt to find SNF placement as the patient would not be a safe discharge home. He lives alone per his sister and unable to care for himself.  Kayleen Memos, MD [facility] Pager [phone]  If 7PM-7AM, please contact night-coverage [email or web address] Password [email or web address] 04/16/2017, 3:17 PM

## 2017-04-16 NOTE — NC FL2 (Signed)
[place] LEVEL OF CARE SCREENING TOOL     IDENTIFICATION  Patient Name: Carlos Schultz Birthdate: 09/21/1940 Sex: male Admission Date (Current Location): 04/13/2017  [facility] and [place] Number:  Herbalist and Address:  [facility] [place] [address], [place], [place] [address]      Provider Number: [id number]  Attending Physician Name and Address:  Kayleen Memos, DO  Relative Name and Phone Number:  Cecilie Lowers, son, [phone]    Current Level of Care: Hospital Recommended Level of Care: [place] Prior Approval Number:    Date Approved/Denied:   PASRR Number: [id number]  Discharge Plan: SNF    Current Diagnoses: Patient Active Problem List   Diagnosis Date Noted  . Essential hypertension 04/13/2017  . Elevated liver function tests 04/13/2017  . Acute metabolic encephalopathy 54/62/7035  . CKD (chronic kidney disease), stage III ([place]) 04/13/2017  . Prostate cancer ([place]) 10/10/2016  . Closed fracture of phalanx or phalanges of hand 05/25/2014  . UTI (urinary tract infection) 05/25/2014  . Cognitive disorder   . Hypoglycemia 05/23/2014  . Pedal edema 05/23/2014  . URINARY INCONTINENCE, URGE 01/03/2009  . FATIGUE 02/22/2008  . Memory loss 12/22/2007  . RENAL INSUFFICIENCY, CHRONIC 09/22/2007  . Type II diabetes mellitus with renal manifestations ([place]) 09/10/2007  . ANEMIA 09/10/2007  . SYNCOPE 09/10/2007  . HYPERLIPIDEMIA 04/02/2007  . HEMATURIA, GROSS 04/02/2007  . ADENOCARCINOMA, PROSTATE, HX OF 06/11/1995    Orientation RESPIRATION BLADDER Height & Weight     (Disoriented x4)  Normal Incontinent, External catheter Weight: 57.2 kg (126 lb 3.2 oz) Height:     BEHAVIORAL SYMPTOMS/MOOD NEUROLOGICAL BOWEL NUTRITION STATUS  Other (Comment)(Sometimes unreceptive to meds at night)   Incontinent Diet(Please see DC Summary)  AMBULATORY STATUS COMMUNICATION OF NEEDS Skin   Limited Assist Verbally Normal                        Personal Care Assistance Level of Assistance  Bathing, Feeding, Dressing Bathing Assistance: Maximum assistance Feeding assistance: Limited assistance Dressing Assistance: Limited assistance     Functional Limitations Info  Sight, Hearing, Speech Sight Info: Adequate Hearing Info: Adequate Speech Info: Adequate    SPECIAL CARE FACTORS FREQUENCY  PT (By licensed PT)     PT Frequency: not yet assessed              Contractures      Additional Factors Info  Code Status, Allergies, Insulin Sliding Scale Code Status Info: Full Allergies Info: NKA   Insulin Sliding Scale Info: 3x daily with meals       Current Medications (04/16/2017):  This is the current hospital active medication list Current Facility-Administered Medications  Medication Dose Route Frequency Provider Last Rate Last Dose  . 0.9 %  sodium chloride infusion   Intravenous Continuous Ivor Costa, MD   Stopped at 04/14/17 1848  . cefTRIAXone (ROCEPHIN) 1 g in dextrose 5 % 50 mL IVPB  1 g Intravenous Q24H Ivor Costa, MD   Stopped at 04/15/17 2222  . dextrose 5 %-0.45 % sodium chloride infusion   Intravenous Continuous Kayleen Memos, DO 75 mL/hr at 04/15/17 2328    . enoxaparin (LOVENOX) injection 40 mg  40 mg Subcutaneous Q24H Ivor Costa, MD   40 mg at 04/15/17 1331  . hydrALAZINE (APRESOLINE) injection 5 mg  5 mg Intravenous Q2H PRN Ivor Costa, MD      . insulin aspart (novoLOG)  injection 0-9 Units  0-9 Units Subcutaneous TID WC Ivor Costa, MD   1 Units at 04/16/17 [phone]  . lactulose (CHRONULAC) enema 200 gm  300 mL Rectal BID Ivor Costa, MD   300 mL at 04/15/17 2108  . ondansetron (ZOFRAN) tablet 4 mg  4 mg Oral Q6H PRN Ivor Costa, MD       Or  . ondansetron [facility]) injection 4 mg  4 mg Intravenous Q6H PRN Ivor Costa, MD         Discharge Medications: Please see discharge summary for a list of discharge medications.  Relevant Imaging Results:  Relevant Lab  Results:   Additional Information SSN: [place], [place]

## 2017-04-16 NOTE — Evaluation (Signed)
Physical Therapy Evaluation Patient Details Name: Carlos Schultz MRN: [id number] DOB: Apr 06, 1941 Today's Date: 04/16/2017   History of Present Illness  Pt is a 76 y/o with PMH of HTN, CKD, DM< CAD, prostate Ca, dementia, and gout admitted with AMS found to have UTI.   Clinical Impression  Pt confused this session.  Requires mod/max for mobility, not safe to ambulate with +1 assist at this time.  Poor attention and difficult to redirect.  Would benefit from ongoing PT and f/u with SNF.    Follow Up Recommendations SNF;Supervision/Assistance - 24 hour    Equipment Recommendations  None recommended by PT    Recommendations for Other Services       Precautions / Restrictions Precautions Precautions: Fall Restrictions Weight Bearing Restrictions: No      Mobility  Bed Mobility Overal bed mobility: Needs Assistance Bed Mobility: Sit to Supine;Supine to Sit     Supine to sit: Max assist Sit to supine: Mod assist   General bed mobility comments: requires assist to initiate and complete movement  Transfers Overall transfer level: Needs assistance   Transfers: Sit to/from Stand Sit to Stand: Mod assist         General transfer comment: attempted with and without RW, pt reaching for things to hold on to when RW removed, but pushing RW out of the way when it was placed in front of him  Ambulation/Gait Ambulation/Gait assistance: Max assist Ambulation Distance (Feet): 3 Feet Assistive device: Rolling walker (2 wheeled);1 person hand held assist       General Gait Details: Pt unable to effectively use RW, decreased following directions with ambulation so terminated after a few steps  Stairs            Wheelchair Mobility    Modified Rankin (Stroke Patients Only)       Balance Overall balance assessment: Needs assistance Sitting-balance support: Bilateral upper extremity supported;Single extremity supported Sitting balance-Leahy Scale: Poor     Standing  balance support: Bilateral upper extremity supported;Single extremity supported Standing balance-Leahy Scale: Zero                               Pertinent Vitals/Pain Pain Assessment: No/denies pain    Home Living Family/patient expects to be discharged to:: Skilled nursing facility Living Arrangements: Alone                    Prior Function Level of Independence: (unclear, pt poor historian and no family at bedside)               Hand Dominance        Extremity/Trunk Assessment   Upper Extremity Assessment Upper Extremity Assessment: Defer to OT evaluation    Lower Extremity Assessment Lower Extremity Assessment: Generalized weakness    Cervical / Trunk Assessment Cervical / Trunk Assessment: Kyphotic  Communication   Communication: Expressive difficulties;Receptive difficulties  Cognition Arousal/Alertness: Awake/alert   Overall Cognitive Status: Impaired/Different from baseline Area of Impairment: Memory;Attention;Orientation;Following commands;Safety/judgement;Awareness;Problem solving                 Orientation Level: Disoriented to;Place;Time;Situation Current Attention Level: Focused   Following Commands: Follows one step commands inconsistently Safety/Judgement: Decreased awareness of safety;Decreased awareness of deficits Awareness: Intellectual Problem Solving: Slow processing;Decreased initiation;Difficulty sequencing;Requires verbal cues;Requires tactile cues        General Comments      Exercises     Assessment/Plan  PT Assessment Patient needs continued PT services  PT Problem List Decreased strength;Decreased activity tolerance;Decreased balance;Decreased mobility;Decreased coordination;Decreased cognition;Decreased knowledge of use of DME;Decreased safety awareness       PT Treatment Interventions DME instruction;Gait training;Functional mobility training;Therapeutic activities;Therapeutic  exercise;Balance training;Neuromuscular re-education;Cognitive remediation;Patient/family education    PT Goals (Current goals can be found in the Care Plan section)  Acute Rehab PT Goals Patient Stated Goal: none PT Goal Formulation: Patient unable to participate in goal setting Time For Goal Achievement: 04/23/17 Potential to Achieve Goals: Fair    Frequency Min 2X/week   Barriers to discharge Decreased caregiver support      Co-evaluation               AM-PAC PT "6 Clicks" Daily Activity  Outcome Measure Difficulty turning over in bed (including adjusting bedclothes, sheets and blankets)?: A Lot Difficulty moving from lying on back to sitting on the side of the bed? : A Lot Difficulty sitting down on and standing up from a chair with arms (e.g., wheelchair, bedside commode, etc,.)?: A Lot Help needed moving to and from a bed to chair (including a wheelchair)?: A Lot Help needed walking in hospital room?: A Lot Help needed climbing 3-5 steps with a railing? : Total 6 Click Score: 11    End of Session     Patient left: in bed;with call bell/phone within reach;with bed alarm set Nurse Communication: Mobility status PT Visit Diagnosis: Unsteadiness on feet (R26.81);Muscle weakness (generalized) (M62.81)    Time: 4825-0037 PT Time Calculation (min) (ACUTE ONLY): 21 min   Charges:   PT Evaluation $PT Eval Moderate Complexity: 1 Mod     PT G Codes:        Michel Santee 04/16/2017, 3:29 PM

## 2017-04-16 NOTE — Care Management Note (Signed)
Case Management Note  Patient Details  Name: STPEHEN PETITJEAN MRN: [id number] Date of Birth: 1941-03-19  Subjective/Objective:   Pt admitted with UTI and acute metabolic encephalopathy                 Action/Plan:  PTA from home.  SNF recommended - CSW following.  CM clarified with attending that pt is not appropriate for [facility] - discharge plan continues to be SNF   Expected Discharge Date:                  Expected Discharge Plan:  [place]  In-House Referral:  Clinical Social Work  Discharge planning Services     Post Acute Care Choice:    Choice offered to:     DME Arranged:    DME Agency:     HH Arranged:    [place] Agency:     Status of Service:     If discussed at [employer], dates discussed:    Additional Comments:  Maryclare Labrador, RN 04/16/2017, 3:07 PM

## 2017-04-17 DIAGNOSIS — N182 Chronic kidney disease, stage 2 (mild): Secondary | ICD-10-CM

## 2017-04-17 LAB — CBC
HCT: 30.1 % — ABNORMAL LOW (ref 39.0–52.0)
HEMOGLOBIN: 9.8 g/dL — AB (ref 13.0–17.0)
MCH: 28.2 pg (ref 26.0–34.0)
MCHC: 32.6 g/dL (ref 30.0–36.0)
MCV: 86.5 fL (ref 78.0–100.0)
PLATELETS: 259 10*3/uL (ref 150–400)
RBC: 3.48 MIL/uL — AB (ref 4.22–5.81)
RDW: 14.6 % (ref 11.5–15.5)
WBC: 5.9 10*3/uL (ref 4.0–10.5)

## 2017-04-17 LAB — COMPREHENSIVE METABOLIC PANEL
ALK PHOS: 67 U/L (ref 38–126)
ALT: 396 U/L — AB (ref 17–63)
AST: 64 U/L — ABNORMAL HIGH (ref 15–41)
Albumin: 2.5 g/dL — ABNORMAL LOW (ref 3.5–5.0)
Anion gap: 7 (ref 5–15)
BUN: 5 mg/dL — ABNORMAL LOW (ref 6–20)
CALCIUM: 8.7 mg/dL — AB (ref 8.9–10.3)
CO2: 21 mmol/L — ABNORMAL LOW (ref 22–32)
CREATININE: 0.87 mg/dL (ref 0.61–1.24)
Chloride: 104 mmol/L (ref 101–111)
Glucose, Bld: 173 mg/dL — ABNORMAL HIGH (ref 65–99)
Potassium: 4.2 mmol/L (ref 3.5–5.1)
Sodium: 132 mmol/L — ABNORMAL LOW (ref 135–145)
Total Bilirubin: 1.1 mg/dL (ref 0.3–1.2)
Total Protein: 5.5 g/dL — ABNORMAL LOW (ref 6.5–8.1)

## 2017-04-17 LAB — GLUCOSE, CAPILLARY
GLUCOSE-CAPILLARY: 166 mg/dL — AB (ref 65–99)
GLUCOSE-CAPILLARY: 100 mg/dL — AB (ref 65–99)

## 2017-04-17 MED ORDER — CIPROFLOXACIN HCL 500 MG PO TABS: 500.0000 mg | ORAL_TABLET | Freq: Two times a day (BID) | ORAL | 0 refills | Status: DC

## 2017-04-17 NOTE — Progress Notes (Signed)
Carlos Schultz to be D/C'd Skilled nursing facility per MD order.  Discussed with the patient and all questions fully answered.  VSS, Skin clean, dry and intact without evidence of skin break down, no evidence of skin tears noted. IV catheter discontinued intact. Site without signs and symptoms of complications. Dressing and pressure applied.  An After Visit Summary was printed and given to the patient. Patient received prescription.  D/c education completed with patient/family including follow up instructions, medication list, d/c activities limitations if indicated, with other d/c instructions as indicated by MD - patient able to verbalize understanding, all questions fully answered.   Patient instructed to return to ED, call 911, or call MD for any changes in condition.   Patient escorted via [name], and D/C home via private auto.  [name] 04/17/2017 3:26 PM

## 2017-04-17 NOTE — Discharge Summary (Signed)
Discharge Summary  Carlos Schultz:[id number] DOB: [birth year]  PCP: Benito Mccreedy, MD  Admit date: 04/13/2017 Discharge date: 04/17/2017  Time spent: 25 minutes  Recommendations for Outpatient Follow-up:  1. Follow up with your PCP within a week 2. Take your medications as prescribed 3. UTI with urine culture growing proteus mirabilis, sensitive to ciprofloxacin 4. Completed 4 days IV rocephin  Discharge Diagnoses:  Active Hospital Problems   Diagnosis Date Noted  . UTI (urinary tract infection) 05/25/2014  . Essential hypertension 04/13/2017  . Elevated liver function tests 04/13/2017  . Acute metabolic encephalopathy 85/27/7824  . CKD (chronic kidney disease), stage III ([facility]) 04/13/2017  . ANEMIA 09/10/2007  . Type II diabetes mellitus with renal manifestations ([place]) 09/10/2007    Resolved Hospital Problems  No resolved problems to display.    Discharge Condition: Stable  Diet recommendation: Resume previous diet  Vitals:   04/16/17 2121 04/17/17 0528  BP: (!) 118/53 (!) 145/67  Pulse: 77 74  Resp: 18 16  Temp: 98.1 F (36.7 C) 98.4 F (36.9 C)  SpO2: 95% 100%    History of present illness:  Carlos Schultz is a 76 y.o. male with medical history significant of hypertension, diabetes mellitus, CAD, CKD-3, prostate cancer, dementia, gout, who presents with altered mental status, dysuria and abdominal pain.  Patient has AMS and dementia, is unable to provide accurate medical history, therefore, most of the history is obtained by discussing the case with ED physician, per EMS report, and with the nursing staff.  Per report, pt became confused on 04/13/17 with generalized weakness. He was reportedly to have some abdominal pain, but not know details. When seen in ED, patient was confused, but stated that he is hurting on urination. U/A positive for UTI with positive urine culture proteus Mirabilis. 4 days of rocephin with improvement of symptoms.  During  hospitalization mental state continued to improve. PT evaluated the patient and recommended SNF with 24 hour supervision and assistance.  The patient's son who is also his medical power of attorney was made aware of a bed offer at SNF and of impending transfer to a skilled nursing facility.   Hospital Course:  Principal Problem:   UTI (urinary tract infection) Active Problems:   Type II diabetes mellitus with renal manifestations (HCC)   ANEMIA   Essential hypertension   Elevated liver function tests   Acute metabolic encephalopathy   CKD (chronic kidney disease), stage III (HCC)  Acute metabolic encephalopathy: -improving -Likely due to UTI. -Rocephin for UTI, 4 days  Proteus mirabilisUTI, present on admission -IV rocephinday#4 -blood cx no growth 2 days -Po cipro 500 mg BID x 7 days  Elevated liver function tests: - AST 1122, ALT 171.  - Abdominal U/S revealed hetergeneous echogenic liver with suspected fatty liver. Etiology is not clear. - Follow up with gastroenterologist as outpatient  Type II diabetes mellitus with renal manifestations (HCC):Last A1c9.9 on 05/24/14, poorly fairly well controled. Patient is takingAmaryl and the 75/25 mix insulinat home -Lantus 7 units daily -SSI, A1C  NormocyticANEMIA: -Hemoglobin stable.  Essential hypertension: -stable -continue home meds  CKD (chronic kidney disease), stage III ([facility]):.  -Stable.  -Baseline creatinine 0.8  Ambulatory dysfunction -PT recommends SNF   Procedures:  None  Consultations:  None  Discharge Exam: BP (!) 145/67 (BP Location: Left Arm)   Pulse 74   Temp 98.4 F (36.9 C) (Oral)   Resp 16   Wt 67.5 kg (148 lb 14.4 oz)   SpO2 100%  BMI 23.67 kg/m   General: Well developed well nourished AAM in NAD Cardiovascular: RRR with no murmurs rubs or gallops Respiratory: CTA with no wheezes, rales, or rohnchi  Discharge Instructions You were cared for by a hospitalist  during your hospital stay. If you have any questions about your discharge medications or the care you received while you were in the hospital after you are discharged, you can call the unit and asked to speak with the hospitalist on call if the hospitalist that took care of you is not available. Once you are discharged, your primary care physician will handle any further medical issues. Please note that NO REFILLS for any discharge medications will be authorized once you are discharged, as it is imperative that you return to your primary care physician (or establish a relationship with a primary care physician if you do not have one) for your aftercare needs so that they can reassess your need for medications and monitor your lab values.   Allergies as of 04/17/2017   No Known Allergies     Medication List    STOP taking these medications   acetaminophen 500 MG tablet Commonly known as:  TYLENOL   glimepiride 4 MG tablet Commonly known as:  AMARYL   HYDROcodone-acetaminophen 5-325 MG tablet Commonly known as:  NORCO/VICODIN     TAKE these medications   amLODipine 5 MG tablet Commonly known as:  NORVASC Take 5 mg by mouth daily.   benazepril 40 MG tablet Commonly known as:  LOTENSIN Take 40 mg by mouth daily.   CALCIUM + D3 PO Take 1 tablet daily by mouth.   ciprofloxacin 500 MG tablet Commonly known as:  CIPRO Take 1 tablet (500 mg total) 2 (two) times daily for 7 days by mouth.   docusate sodium 100 MG capsule Commonly known as:  COLACE Take 1 capsule (100 mg total) by mouth 2 (two) times daily. What changed:    when to take this  reasons to take this   Fish Oil 1000 MG Caps Take 1,000 mg daily by mouth.   furosemide 40 MG tablet Commonly known as:  LASIX Take 40 mg by mouth daily.   HUMALOG MIX 75/25 KWIKPEN (75-25) 100 UNIT/ML Kwikpen Generic drug:  Insulin Lispro Prot & Lispro Inject 10-20 Units 2 (two) times daily as needed into the skin (CBG >200).     memantine 5 MG tablet Commonly known as:  NAMENDA Take 5 mg 2 (two) times daily by mouth.   metFORMIN 500 MG tablet Commonly known as:  GLUCOPHAGE Take 500 mg by mouth 2 (two) times daily.   multivitamin with minerals Tabs tablet Take 1 tablet by mouth daily.   tetrahydrozoline 0.05 % ophthalmic solution Place 1 drop 2 (two) times daily into both eyes.   XTANDI 40 MG capsule Generic drug:  enzalutamide Take 160 mg daily by mouth.      No Known Allergies  Contact information for follow-up providers    Benito Mccreedy, MD Follow up.   Specialty:  Internal Medicine Contact information: [address] [place] [address] [phone]            Contact information for after-discharge care    [facility] Follow up.   Service:  Skilled Nursing Contact information: [place] [phone]                   The results of significant diagnostics  from this hospitalization (including imaging, microbiology, ancillary and laboratory) are listed below for reference.    Significant Diagnostic Studies: Ct Head Wo Contrast  Result Date: 04/13/2017 CLINICAL DATA:  Increased weakness and confusion; LSN yesterday morning. Pt baseline dementia, per ems typically walks and can hold a conversation and has been unable to do as well since yesterday AM) EXAM: CT HEAD WITHOUT CONTRAST TECHNIQUE: Contiguous axial images were obtained from the base of the skull through the vertex without intravenous contrast. COMPARISON:  05/23/2014 FINDINGS: Brain: No evidence of acute infarction, hemorrhage, hydrocephalus, extra-axial collection or mass lesion/mass effect. There is age appropriate ventricular and sulcal enlargement. Periventricular white matter hypoattenuation is noted consistent with mild chronic microvascular ischemic change. Vascular: No hyperdense vessel or unexpected calcification.  Skull: Normal. Negative for fracture or focal lesion. Sinuses/Orbits: Globes and orbits are unremarkable. Visualized sinuses and mastoid air cells are clear. Other: None. IMPRESSION: 1. No acute intracranial abnormalities. 2. Age related volume loss. Mild chronic microvascular ischemic change. Electronically Signed   By: Lajean Manes M.D.   On: 04/13/2017 18:39   US Abdomen Complete  Result Date: 04/13/2017 CLINICAL DATA:  Elevated LFTs EXAM: ABDOMEN ULTRASOUND COMPLETE COMPARISON:  CT 01/15/2016, ultrasound 12/16/2009 FINDINGS: Gallbladder: Limited visibility due to bowel gas. No gallstones or wall thickening visualized. No sonographic Murphy sign noted by sonographer. Common bile duct: Diameter: 3.7 mm Liver: Increased echogenicity suggesting fatty change. Heterogeneous echotexture. Portal vein is patent on color Doppler imaging with normal direction of blood flow towards the liver. IVC: No abnormality visualized. Pancreas: Not seen due to bowel gas Spleen: Size and appearance within normal limits. Right Kidney: Length: 8.1 cm. Echogenicity within normal limits. No mass or hydronephrosis visualized. Left Kidney: Length: 9.6 cm. Echogenicity within normal limits. No mass or hydronephrosis visualized. Abdominal aorta: Not well seen due to bowel gas. Other findings: None. IMPRESSION: 1. The study is limited by copious bowel gas with limited visibility of the pancreas, gallbladder, and aorta. 2. Negative for gallstones or biliary dilatation. 3. Heterogeneous echogenic liver suggesting fatty infiltration Electronically Signed   By: Donavan Foil M.D.   On: 04/13/2017 22:48   Dg Chest Portable 1 View  Result Date: 04/13/2017 CLINICAL DATA:  Confusion and weakness EXAM: PORTABLE CHEST 1 VIEW COMPARISON:  05/23/2014 FINDINGS: No acute pulmonary infiltrate, consolidation or pleural effusion is visualized. The cardiomediastinal silhouette is stable. Aortic atherosclerosis. No pneumothorax. IMPRESSION: No active  disease. Electronically Signed   By: Donavan Foil M.D.   On: 04/13/2017 17:58    Microbiology: Recent Results (from the past 240 hour(s))  Culture, blood (x 2)     Status: None (Preliminary result)   Collection Time: 04/13/17  7:25 PM  Result Value Ref Range Status   Specimen Description BLOOD RIGHT HAND  Final   Special Requests IN PEDIATRIC BOTTLE Blood Culture adequate volume  Final   Culture NO GROWTH 3 DAYS  Final   Report Status PENDING  Incomplete  Urine culture     Status: Abnormal   Collection Time: 04/13/17  8:39 PM  Result Value Ref Range Status   Specimen Description URINE, RANDOM  Final   Special Requests NONE  Final   Culture >=100,000 COLONIES/mL PROTEUS MIRABILIS (A)  Final   Report Status 04/16/2017 FINAL  Final   Organism ID, Bacteria PROTEUS MIRABILIS (A)  Final      Susceptibility   Proteus mirabilis - MIC*    AMPICILLIN <=2 SENSITIVE Sensitive     CEFAZOLIN <=4 SENSITIVE Sensitive  CEFTRIAXONE <=1 SENSITIVE Sensitive     CIPROFLOXACIN <=0.25 SENSITIVE Sensitive     GENTAMICIN <=1 SENSITIVE Sensitive     IMIPENEM 2 SENSITIVE Sensitive     NITROFURANTOIN 128 RESISTANT Resistant     TRIMETH/SULFA <=20 SENSITIVE Sensitive     AMPICILLIN/SULBACTAM <=2 SENSITIVE Sensitive     PIP/TAZO <=4 SENSITIVE Sensitive     * >=100,000 COLONIES/mL PROTEUS MIRABILIS  Culture, blood (x 2)     Status: None (Preliminary result)   Collection Time: 04/14/17 12:45 AM  Result Value Ref Range Status   Specimen Description BLOOD RIGHT HAND  Final   Special Requests IN PEDIATRIC BOTTLE Blood Culture adequate volume  Final   Culture NO GROWTH 3 DAYS  Final   Report Status PENDING  Incomplete     Labs: Basic Metabolic Panel: Recent Labs  Lab 04/13/17 1645 04/15/17 0630 04/17/17 0614  NA 135 135 132*  K 4.3 3.8 4.2  CL 96* 104 104  CO2 26 24 21*  GLUCOSE 263* 174* 173*  BUN 21* 12 5*  CREATININE 1.39* 0.98 0.87  CALCIUM 9.6 9.1 8.7*   Liver Function Tests: Recent  Labs  Lab 04/13/17 1645 04/17/17 0614  AST 171* 64*  ALT 1,122* 396*  ALKPHOS 80 67  BILITOT 1.6* 1.1  PROT 6.5 5.5*  ALBUMIN 3.1* 2.5*   Recent Labs  Lab 04/13/17 1921  LIPASE 18   Recent Labs  Lab 04/13/17 1921  AMMONIA 47*   CBC: Recent Labs  Lab 04/13/17 1645 04/15/17 0630 04/17/17 0614  WBC 6.7 6.3 5.9  NEUTROABS 4.0  --   --   HGB 9.9* 9.4* 9.8*  HCT 30.1* 29.0* 30.1*  MCV 84.1 85.5 86.5  PLT 274 233 259   Cardiac Enzymes: Recent Labs  Lab 04/14/17 0041 04/14/17 0627 04/14/17 1116  TROPONINI <0.03 <0.03 <0.03   BNP: BNP (last 3 results) No results for input(s): BNP in the last 8760 hours.  ProBNP (last 3 results) No results for input(s): PROBNP in the last 8760 hours.  CBG: Recent Labs  Lab 04/16/17 1156 04/16/17 1718 04/16/17 2116 04/17/17 0756 04/17/17 1152  GLUCAP 184* 95 242* 166* 100*       Signed:  Kayleen Memos, MD [facility] 04/17/2017, 1:13 PM

## 2017-04-17 NOTE — Clinical Social Work Placement (Signed)
   CLINICAL SOCIAL WORK PLACEMENT  NOTE  Date:  04/17/2017  Patient Details  Name: Carlos Schultz MRN: [id number] Date of Birth: 1940/12/30  Clinical Social Work is seeking post-discharge placement for this patient at the [place] level of care (*CSW will initial, date and re-position this form in  chart as items are completed):  Yes   Patient/family provided with [place] Work Department's list of facilities offering this level of care within the geographic area requested by the patient (or if unable, by the patient's family).  Yes   Patient/family informed of their freedom to choose among providers that offer the needed level of care, that participate in Medicare, Medicaid or managed care program needed by the patient, have an available bed and are willing to accept the patient.  Yes   Patient/family informed of [facility]'s ownership interest in [facility] and [facility], as well as of the fact that they are under no obligation to receive care at these facilities.  PASRR submitted to EDS on       PASRR number received on       Existing PASRR number confirmed on 04/16/17     FL2 transmitted to all facilities in geographic area requested by pt/family on 04/16/17     FL2 transmitted to all facilities within larger geographic area on       Patient informed that his/her managed care company has contracts with or will negotiate with certain facilities, including the following:        Yes   Patient/family informed of bed offers received.  Patient [facility] bed at [facility]     Physician recommends and patient chooses bed at      Patient to be transferred to [facility] on 04/17/17.  Patient to be transferred to facility by [facility]     Patient family notified on 04/17/17 of transfer.  Name of family member notified:  Cecilie Lowers, son     PHYSICIAN Please sign FL2     Additional Comment:     _______________________________________________ Benard Halsted, [place] 04/17/2017, 2:20 PM

## 2017-04-17 NOTE — Discharge Instructions (Signed)
Urinary Tract Infection, Adult °A urinary tract infection (UTI) is an infection of any part of the urinary tract. The urinary tract includes the: °· Kidneys. °· Ureters. °· Bladder. °· Urethra. ° °These organs make, store, and get rid of pee (urine) in the body. °Follow these instructions at home: °· Take over-the-counter and prescription medicines only as told by your doctor. °· If you were prescribed an antibiotic medicine, take it as told by your doctor. Do not stop taking the antibiotic even if you start to feel better. °· Avoid the following drinks: °? Alcohol. °? Caffeine. °? Tea. °? Carbonated drinks. °· Drink enough fluid to keep your pee clear or pale yellow. °· Keep all follow-up visits as told by your doctor. This is important. °· Make sure to: °? Empty your bladder often and completely. Do not to hold pee for long periods of time. °? Empty your bladder before and after sex. °? Wipe from front to back after a bowel movement if you are male. Use each tissue one time when you wipe. °Contact a doctor if: °· You have back pain. °· You have a fever. °· You feel sick to your stomach (nauseous). °· You throw up (vomit). °· Your symptoms do not get better after 3 days. °· Your symptoms go away and then come back. °Get help right away if: °· You have very bad back pain. °· You have very bad lower belly (abdominal) pain. °· You are throwing up and cannot keep down any medicines or water. °This information is not intended to replace advice given to you by your health care provider. Make sure you discuss any questions you have with your health care provider. °Document Released: 11/13/2007 Document Revised: 11/02/2015 Document Reviewed: 04/17/2015 °Elsevier Interactive Patient Education © 2018 Elsevier Inc. ° °

## 2017-04-17 NOTE — Progress Notes (Signed)
Patient will DC to: [facility] Anticipated DC date: 04/17/17 Family notified: Son Transport by: Domenica Reamer   Per MD patient ready for DC to [facility]. RN, patient, patient's family, and facility notified of DC. Discharge Summary sent to facility. RN given number for report [phone]). DC packet on chart. Ambulance transport requested for patient.   CSW signing off.  Cedric Fishman, [place] Social Worker [phone]

## 2017-04-17 NOTE — Clinical Social Work Note (Signed)
Clinical Social Work Assessment  Patient Details  Name: Carlos Schultz MRN: [id number] Date of Birth: Mar 31, 1941  Date of referral:  04/17/17               Reason for consult:  Facility Placement                Permission sought to share information with:  Facility Sport and exercise psychologist, Family Supports Permission granted to share information::  No(Disoriented)  Name::     Engineer, petroleum::  SNFs  Relationship::  Son  Contact Information:  [phone]  Housing/Transportation Living arrangements for the past 2 months:  Apartment Source of Information:  Adult Children Patient Interpreter Needed:  None Criminal Activity/Legal Involvement Pertinent to Current Situation/Hospitalization:  No - Comment as needed Significant Relationships:  Adult Children Lives with:  Self Do you feel safe going back to the place where you live?  No Need for family participation in patient care:  Yes (Comment)  Care giving concerns:  CSW received consult for possible SNF placement at time of discharge. CSW spoke with patient's son regarding PT recommendation of SNF placement at time of discharge. Patient's son reported that patient's family is currently unable to care for patient given patient's current physical needs and fall risk. Patient's son expressed understanding of PT recommendation and is agreeable to SNF placement at time of discharge. CSW to continue to follow and assist with discharge planning needs.   Social Worker assessment / plan:  CSW spoke with patient's son concerning possibility of rehab at [facility] before returning home.  Employment status:  Retired Nurse, adult PT Recommendations:  [place] / Referral to community resources:  [place]  Patient/Family's Response to care:  Patient's son recognizes need for rehab before returning home and is agreeable to a SNF in [place]. Patient reported preference for  [facility] since patient has been there before.  Patient/Family's Understanding of and Emotional Response to Diagnosis, Current Treatment, and Prognosis:  Patient/family is realistic regarding therapy needs and expressed being hopeful for SNF placement. Patient's son expressed understanding of CSW role and discharge process as well as medical condition. No questions/concerns about plan or treatment.    Emotional Assessment Appearance:  Appears stated age Attitude/Demeanor/Rapport:  Unable to Assess Affect (typically observed):  Unable to Assess Orientation:  (Disoriented x4) Alcohol / Substance use:  Not Applicable Psych involvement (Current and /or in the community):  No (Comment)  Discharge Needs  Concerns to be addressed:  Care Coordination Readmission within the last 30 days:  No Current discharge risk:  None Barriers to Discharge:  Continued Medical Work up   [employer], [place] 04/17/2017, 8:35 AM

## 2017-04-19 LAB — CULTURE, BLOOD (ROUTINE X 2)
Culture: NO GROWTH
Culture: NO GROWTH
SPECIAL REQUESTS: ADEQUATE
Special Requests: ADEQUATE

## 2017-04-23 ENCOUNTER — Inpatient Hospital Stay (HOSPITAL_COMMUNITY)
Admission: EM | Admit: 2017-04-23 | Discharge: 2017-04-27 | DRG: 481 | Disposition: A | Discharge status: Skilled Nursing Facility | Payer: Medicare Other | Attending: Internal Medicine | Admitting: Internal Medicine

## 2017-04-23 ENCOUNTER — Emergency Department (HOSPITAL_COMMUNITY): Payer: Medicare Other

## 2017-04-23 ENCOUNTER — Encounter (HOSPITAL_COMMUNITY): Payer: Self-pay

## 2017-04-23 DIAGNOSIS — R413 Other amnesia: Secondary | ICD-10-CM | POA: Diagnosis present

## 2017-04-23 DIAGNOSIS — N179 Acute kidney failure, unspecified: Secondary | ICD-10-CM | POA: Diagnosis not present

## 2017-04-23 DIAGNOSIS — Z794 Long term (current) use of insulin: Secondary | ICD-10-CM

## 2017-04-23 DIAGNOSIS — E785 Hyperlipidemia, unspecified: Secondary | ICD-10-CM | POA: Diagnosis present

## 2017-04-23 DIAGNOSIS — E1165 Type 2 diabetes mellitus with hyperglycemia: Secondary | ICD-10-CM | POA: Diagnosis present

## 2017-04-23 DIAGNOSIS — Z79899 Other long term (current) drug therapy: Secondary | ICD-10-CM

## 2017-04-23 DIAGNOSIS — F09 Unspecified mental disorder due to known physiological condition: Secondary | ICD-10-CM | POA: Diagnosis present

## 2017-04-23 DIAGNOSIS — N182 Chronic kidney disease, stage 2 (mild): Secondary | ICD-10-CM

## 2017-04-23 DIAGNOSIS — Z8546 Personal history of malignant neoplasm of prostate: Secondary | ICD-10-CM

## 2017-04-23 DIAGNOSIS — I129 Hypertensive chronic kidney disease with stage 1 through stage 4 chronic kidney disease, or unspecified chronic kidney disease: Secondary | ICD-10-CM | POA: Diagnosis present

## 2017-04-23 DIAGNOSIS — M109 Gout, unspecified: Secondary | ICD-10-CM | POA: Diagnosis present

## 2017-04-23 DIAGNOSIS — F0390 Unspecified dementia without behavioral disturbance: Secondary | ICD-10-CM | POA: Diagnosis present

## 2017-04-23 DIAGNOSIS — Z87891 Personal history of nicotine dependence: Secondary | ICD-10-CM

## 2017-04-23 DIAGNOSIS — S72141A Displaced intertrochanteric fracture of right femur, initial encounter for closed fracture: Principal | ICD-10-CM | POA: Diagnosis present

## 2017-04-23 DIAGNOSIS — N183 Chronic kidney disease, stage 3 (moderate): Secondary | ICD-10-CM | POA: Diagnosis present

## 2017-04-23 DIAGNOSIS — I252 Old myocardial infarction: Secondary | ICD-10-CM

## 2017-04-23 DIAGNOSIS — S72001A Fracture of unspecified part of neck of right femur, initial encounter for closed fracture: Secondary | ICD-10-CM | POA: Diagnosis present

## 2017-04-23 DIAGNOSIS — C61 Malignant neoplasm of prostate: Secondary | ICD-10-CM | POA: Diagnosis present

## 2017-04-23 DIAGNOSIS — I10 Essential (primary) hypertension: Secondary | ICD-10-CM | POA: Diagnosis present

## 2017-04-23 DIAGNOSIS — Z419 Encounter for procedure for purposes other than remedying health state, unspecified: Secondary | ICD-10-CM

## 2017-04-23 DIAGNOSIS — D649 Anemia, unspecified: Secondary | ICD-10-CM | POA: Diagnosis present

## 2017-04-23 DIAGNOSIS — E1129 Type 2 diabetes mellitus with other diabetic kidney complication: Secondary | ICD-10-CM | POA: Diagnosis present

## 2017-04-23 DIAGNOSIS — D638 Anemia in other chronic diseases classified elsewhere: Secondary | ICD-10-CM | POA: Diagnosis present

## 2017-04-23 DIAGNOSIS — Z833 Family history of diabetes mellitus: Secondary | ICD-10-CM

## 2017-04-23 DIAGNOSIS — S72009A Fracture of unspecified part of neck of unspecified femur, initial encounter for closed fracture: Secondary | ICD-10-CM | POA: Diagnosis present

## 2017-04-23 DIAGNOSIS — N189 Chronic kidney disease, unspecified: Secondary | ICD-10-CM | POA: Diagnosis present

## 2017-04-23 DIAGNOSIS — E1122 Type 2 diabetes mellitus with diabetic chronic kidney disease: Secondary | ICD-10-CM | POA: Diagnosis present

## 2017-04-23 DIAGNOSIS — W1830XA Fall on same level, unspecified, initial encounter: Secondary | ICD-10-CM | POA: Diagnosis present

## 2017-04-23 HISTORY — DX: Fracture of unspecified part of neck of right femur, initial encounter for closed fracture: S72.001A

## 2017-04-23 LAB — CBC WITH DIFFERENTIAL/PLATELET
BASOS PCT: 0 %
Basophils Absolute: 0 10*3/uL (ref 0.0–0.1)
EOS ABS: 0 10*3/uL (ref 0.0–0.7)
Eosinophils Relative: 0 %
HEMATOCRIT: 28.7 % — AB (ref 39.0–52.0)
HEMOGLOBIN: 9.4 g/dL — AB (ref 13.0–17.0)
Lymphocytes Relative: 13 %
Lymphs Abs: 1.2 10*3/uL (ref 0.7–4.0)
MCH: 28.2 pg (ref 26.0–34.0)
MCHC: 32.8 g/dL (ref 30.0–36.0)
MCV: 86.2 fL (ref 78.0–100.0)
Monocytes Absolute: 1.4 10*3/uL — ABNORMAL HIGH (ref 0.1–1.0)
Monocytes Relative: 15 %
NEUTROS ABS: 6.8 10*3/uL (ref 1.7–7.7)
NEUTROS PCT: 72 %
Platelets: 296 10*3/uL (ref 150–400)
RBC: 3.33 MIL/uL — AB (ref 4.22–5.81)
RDW: 14.3 % (ref 11.5–15.5)
WBC: 9.3 10*3/uL (ref 4.0–10.5)

## 2017-04-23 LAB — URINALYSIS, ROUTINE W REFLEX MICROSCOPIC
BACTERIA UA: NONE SEEN
Bilirubin Urine: NEGATIVE
Glucose, UA: NEGATIVE mg/dL
HGB URINE DIPSTICK: NEGATIVE
Ketones, ur: NEGATIVE mg/dL
Nitrite: NEGATIVE
PROTEIN: NEGATIVE mg/dL
Specific Gravity, Urine: 1.018 (ref 1.005–1.030)
pH: 5 (ref 5.0–8.0)
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
## 2017-04-23 NOTE — ED Notes (Signed)
EKG given to EDP,Nanavati,MD., for review. 

## 2017-04-23 NOTE — ED Notes (Signed)
Bed: WA04 Expected date:  Expected time:  Means of arrival:  Comments: EMS 77 yo male from [facility]-fall some time today/x-rays-fracture right femur unknown age-shortening and rotation to right leg/dementia

## 2017-04-23 NOTE — ED Triage Notes (Signed)
Pt BIB EMS from [facility]. Per EMS pt had an unwitnessed fall sometime today. Facility was unable to inform EMS when this fall occurred. Portable x-ray from [facility] shows fracture of right femur of "indeterminate age." Right leg noted to be shortened and rotated. Pt has been recently treated for a UTI and has had increasing AMS. Hx of dementia.

## 2017-04-23 NOTE — ED Notes (Signed)
Pt transported to Xray. 

## 2017-04-24 ENCOUNTER — Other Ambulatory Visit: Payer: Self-pay

## 2017-04-24 ENCOUNTER — Inpatient Hospital Stay (HOSPITAL_COMMUNITY): Payer: Medicare Other

## 2017-04-24 ENCOUNTER — Encounter (HOSPITAL_COMMUNITY): Payer: Self-pay | Admitting: Internal Medicine

## 2017-04-24 DIAGNOSIS — R413 Other amnesia: Secondary | ICD-10-CM | POA: Diagnosis not present

## 2017-04-24 DIAGNOSIS — D638 Anemia in other chronic diseases classified elsewhere: Secondary | ICD-10-CM | POA: Diagnosis present

## 2017-04-24 DIAGNOSIS — Z79899 Other long term (current) drug therapy: Secondary | ICD-10-CM | POA: Diagnosis not present

## 2017-04-24 DIAGNOSIS — N183 Chronic kidney disease, stage 3 (moderate): Secondary | ICD-10-CM | POA: Diagnosis present

## 2017-04-24 DIAGNOSIS — F09 Unspecified mental disorder due to known physiological condition: Secondary | ICD-10-CM | POA: Diagnosis present

## 2017-04-24 DIAGNOSIS — S72009A Fracture of unspecified part of neck of unspecified femur, initial encounter for closed fracture: Secondary | ICD-10-CM | POA: Diagnosis present

## 2017-04-24 DIAGNOSIS — N179 Acute kidney failure, unspecified: Secondary | ICD-10-CM | POA: Diagnosis present

## 2017-04-24 DIAGNOSIS — E1165 Type 2 diabetes mellitus with hyperglycemia: Secondary | ICD-10-CM | POA: Diagnosis present

## 2017-04-24 DIAGNOSIS — I10 Essential (primary) hypertension: Secondary | ICD-10-CM | POA: Diagnosis not present

## 2017-04-24 DIAGNOSIS — D649 Anemia, unspecified: Secondary | ICD-10-CM | POA: Diagnosis not present

## 2017-04-24 DIAGNOSIS — S72001A Fracture of unspecified part of neck of right femur, initial encounter for closed fracture: Secondary | ICD-10-CM | POA: Diagnosis present

## 2017-04-24 DIAGNOSIS — Z794 Long term (current) use of insulin: Secondary | ICD-10-CM | POA: Diagnosis not present

## 2017-04-24 DIAGNOSIS — Z833 Family history of diabetes mellitus: Secondary | ICD-10-CM | POA: Diagnosis not present

## 2017-04-24 DIAGNOSIS — S72141A Displaced intertrochanteric fracture of right femur, initial encounter for closed fracture: Secondary | ICD-10-CM | POA: Diagnosis present

## 2017-04-24 DIAGNOSIS — F0390 Unspecified dementia without behavioral disturbance: Secondary | ICD-10-CM | POA: Diagnosis present

## 2017-04-24 DIAGNOSIS — W1830XA Fall on same level, unspecified, initial encounter: Secondary | ICD-10-CM | POA: Diagnosis present

## 2017-04-24 DIAGNOSIS — E1122 Type 2 diabetes mellitus with diabetic chronic kidney disease: Secondary | ICD-10-CM | POA: Diagnosis present

## 2017-04-24 DIAGNOSIS — I252 Old myocardial infarction: Secondary | ICD-10-CM | POA: Diagnosis not present

## 2017-04-24 DIAGNOSIS — M109 Gout, unspecified: Secondary | ICD-10-CM | POA: Diagnosis present

## 2017-04-24 DIAGNOSIS — E785 Hyperlipidemia, unspecified: Secondary | ICD-10-CM | POA: Diagnosis present

## 2017-04-24 DIAGNOSIS — Z8546 Personal history of malignant neoplasm of prostate: Secondary | ICD-10-CM | POA: Diagnosis not present

## 2017-04-24 DIAGNOSIS — I129 Hypertensive chronic kidney disease with stage 1 through stage 4 chronic kidney disease, or unspecified chronic kidney disease: Secondary | ICD-10-CM | POA: Diagnosis present

## 2017-04-24 DIAGNOSIS — Z87891 Personal history of nicotine dependence: Secondary | ICD-10-CM | POA: Diagnosis not present

## 2017-04-24 LAB — COMPREHENSIVE METABOLIC PANEL
ALBUMIN: 3 g/dL — AB (ref 3.5–5.0)
ALK PHOS: 61 U/L (ref 38–126)
ALT: 127 U/L — AB (ref 17–63)
AST: 39 U/L (ref 15–41)
Anion gap: 11 (ref 5–15)
BUN: 24 mg/dL — AB (ref 6–20)
CALCIUM: 9.1 mg/dL (ref 8.9–10.3)
CO2: 21 mmol/L — AB (ref 22–32)
CREATININE: 1.42 mg/dL — AB (ref 0.61–1.24)
Chloride: 100 mmol/L — ABNORMAL LOW (ref 101–111)
GFR calc Af Amer: 53 mL/min — ABNORMAL LOW (ref >60)
GFR calc non Af Amer: 46 mL/min — ABNORMAL LOW (ref >60)
GLUCOSE: 178 mg/dL — AB (ref 65–99)
Potassium: 4.1 mmol/L (ref 3.5–5.1)
SODIUM: 132 mmol/L — AB (ref 135–145)
Total Bilirubin: 1.4 mg/dL — ABNORMAL HIGH (ref 0.3–1.2)
Total Protein: 6.5 g/dL (ref 6.5–8.1)

## 2017-04-24 LAB — ABO/RH
ABO/RH(D): B POS
ABO/RH(D): B POS

## 2017-04-24 LAB — PROTIME-INR
INR: 1.07
PROTHROMBIN TIME: 13.8 s (ref 11.4–15.2)

## 2017-04-24 LAB — TYPE AND SCREEN
ABO/RH(D): B POS
ABO/RH(D): B POS
ANTIBODY SCREEN: NEGATIVE
ANTIBODY SCREEN: NEGATIVE

## 2017-04-24 LAB — GLUCOSE, CAPILLARY
GLUCOSE-CAPILLARY: 55 mg/dL — AB (ref 65–99)
GLUCOSE-CAPILLARY: 56 mg/dL — AB (ref 65–99)
GLUCOSE-CAPILLARY: 194 mg/dL — AB (ref 65–99)
Glucose-Capillary: 161 mg/dL — ABNORMAL HIGH (ref 65–99)
Glucose-Capillary: 49 mg/dL — ABNORMAL LOW (ref 65–99)
Glucose-Capillary: 58 mg/dL — ABNORMAL LOW (ref 65–99)
Glucose-Capillary: 174 mg/dL — ABNORMAL HIGH (ref 65–99)

## 2017-04-24 LAB — SURGICAL PCR SCREEN
MRSA, PCR: NEGATIVE
Staphylococcus aureus: NEGATIVE

## 2017-04-24 MED ORDER — HYDROCODONE-ACETAMINOPHEN 5-325 MG PO TABS
1.0000 | ORAL_TABLET | Freq: Four times a day (QID) | ORAL | Status: DC | PRN
  Administered 2017-04-24: 1 via ORAL
  Filled 2017-04-24: qty 1

## 2017-04-24 MED ORDER — INSULIN ASPART 100 UNIT/ML ~~LOC~~ SOLN
0.0000 [IU] | Freq: Three times a day (TID) | SUBCUTANEOUS | Status: DC
  Administered 2017-04-24: 2 [IU] via SUBCUTANEOUS
  Administered 2017-04-25 – 2017-04-26 (×3): 3 [IU] via SUBCUTANEOUS
  Administered 2017-04-26: 2 [IU] via SUBCUTANEOUS
  Administered 2017-04-27: 7 [IU] via SUBCUTANEOUS

## 2017-04-24 MED ORDER — CHLORHEXIDINE GLUCONATE 4 % EX LIQD: 60.0000 mL | Freq: Once | CUTANEOUS | Status: DC

## 2017-04-24 MED ORDER — DEXTROSE 50 % IV SOLN
INTRAVENOUS | Status: AC
  Filled 2017-04-24: qty 50

## 2017-04-24 MED ORDER — FISH OIL 1000 MG PO CAPS: 1000.0000 mg | ORAL_CAPSULE | Freq: Every day | ORAL | Status: DC

## 2017-04-24 MED ORDER — DOCUSATE SODIUM 100 MG PO CAPS: 100.0000 mg | ORAL_CAPSULE | Freq: Two times a day (BID) | ORAL | Status: DC | PRN

## 2017-04-24 MED ORDER — POVIDONE-IODINE 10 % EX SWAB
2.0000 "application " | Freq: Once | CUTANEOUS | Status: AC
  Administered 2017-04-25: 2 via TOPICAL

## 2017-04-24 MED ORDER — DEXTROSE 50 % IV SOLN
25.0000 mL | Freq: Once | INTRAVENOUS | Status: AC
  Administered 2017-04-24: 25 mL via INTRAVENOUS

## 2017-04-24 MED ORDER — CEFAZOLIN SODIUM-DEXTROSE 2-4 GM/100ML-% IV SOLN: 2.0000 g | INTRAVENOUS | Status: DC

## 2017-04-24 MED ORDER — MORPHINE SULFATE (PF) 2 MG/ML IV SOLN: 0.5000 mg | INTRAVENOUS | Status: DC | PRN

## 2017-04-24 MED ORDER — MEMANTINE HCL 10 MG PO TABS
5.0000 mg | ORAL_TABLET | Freq: Two times a day (BID) | ORAL | Status: DC
  Administered 2017-04-24 – 2017-04-27 (×7): 5 mg via ORAL
  Filled 2017-04-24 (×9): qty 1

## 2017-04-24 MED ORDER — HYDRALAZINE HCL 20 MG/ML IJ SOLN: 10.0000 mg | INTRAMUSCULAR | Status: DC | PRN

## 2017-04-24 MED ORDER — OMEGA-3-ACID ETHYL ESTERS 1 G PO CAPS
1.0000 g | ORAL_CAPSULE | Freq: Every day | ORAL | Status: DC
  Administered 2017-04-24 – 2017-04-27 (×3): 1 g via ORAL
  Filled 2017-04-24 (×2): qty 1

## 2017-04-24 MED ORDER — MORPHINE SULFATE (PF) 4 MG/ML IV SOLN: 0.5000 mg | INTRAVENOUS | Status: DC | PRN

## 2017-04-24 MED ORDER — ENZALUTAMIDE 40 MG PO CAPS: 160.0000 mg | ORAL_CAPSULE | Freq: Every day | ORAL | Status: DC

## 2017-04-24 MED ORDER — ADULT MULTIVITAMIN W/MINERALS CH
1.0000 | ORAL_TABLET | Freq: Every day | ORAL | Status: DC
  Administered 2017-04-24 – 2017-04-27 (×4): 1 via ORAL
  Filled 2017-04-24 (×4): qty 1

## 2017-04-24 MED ORDER — AMLODIPINE BESYLATE 5 MG PO TABS
5.0000 mg | ORAL_TABLET | Freq: Every day | ORAL | Status: DC
  Administered 2017-04-25 – 2017-04-27 (×3): 5 mg via ORAL
  Filled 2017-04-24 (×3): qty 1

## 2017-04-24 MED ORDER — SODIUM CHLORIDE 0.9 % IV BOLUS (SEPSIS)
500.0000 mL | Freq: Once | INTRAVENOUS | Status: AC
  Administered 2017-04-24: 500 mL via INTRAVENOUS

## 2017-04-24 MED ORDER — ENSURE ENLIVE PO LIQD
237.0000 mL | Freq: Two times a day (BID) | ORAL | Status: DC
  Administered 2017-04-24 – 2017-04-27 (×5): 237 mL via ORAL

## 2017-04-24 MED ORDER — SODIUM CHLORIDE 0.9 % IV SOLN: Freq: Once | INTRAVENOUS | Status: DC

## 2017-04-24 NOTE — Progress Notes (Signed)
Patient seen and examined, this is a 76 year old male with moderate dementia, hypertension, CHF, type 2 diabetes, history of prostate cancer recently hospitalized with urinary tract infection and metabolic encephalopathy, went to [facility] SNF on 11/8. -Admitted following a mechanical fall and found to have right hip fracture and mild acute kidney injury. -Pt is pleasantly confused and oblivious to current situation -Agricultural engineer, plan for OR tomorrow -he is low cardiac risk for an intermediate risk procedure, no further workup recommended at this time he will be at high risk for delirium postop, caution with sedating meds -Hold ACE and lasix -labs in am  Domenic Polite, MD

## 2017-04-24 NOTE — ED Notes (Signed)
Pt has been stuck multiple times for an IV without success

## 2017-04-24 NOTE — Progress Notes (Signed)
Initial Nutrition Assessment  DOCUMENTATION CODES:   Non-severe (moderate) malnutrition in context of chronic illness  INTERVENTION:   - Ensure Enlive BID; each supplement provides 350 kcals and 20 grams of protein  NUTRITION DIAGNOSIS:   Moderate Malnutrition related to chronic illness(dementia, DM, HTN) as evidenced by mild fat depletion, mild muscle depletion, percent weight loss.  GOAL:   Patient will meet greater than or equal to 90% of their needs  MONITOR:   PO intake, Supplement acceptance, Labs, Weight trends  REASON FOR ASSESSMENT:   Consult Hip fracture protocol  ASSESSMENT:   Pt is a 76 year old male with PMH of dementia, DM, and HTN. Pt experienced an unwitnessed fall at the SNF in which he resides. This was the second fall in 24 hours according to pts son. Pt was admitted last week for AMS secondary to UTI.  Pt has severe dementia and was unable to contribute dietary information during interview. Pts tray had just arrived to his room upon visit. Spoke with RN who is planning to give pt tray. He has only been admitted since last night so there is no diet hx available.  Per weight records in chart, pt weighed 148 lbs on 11/8. His weight was recorded at 180 lbs in 10/2016. This indicates a 17.8% weight loss in six months, severe for time frame.   Medications- Novolog, MVI  Labs- Na 132 (H), Glucose 178 (H), BUN 24 (H), Cr 1.42 (H)  NUTRITION - FOCUSED PHYSICAL EXAM:    Most Recent Value  Orbital Region  Moderate depletion  Upper Arm Region  No depletion  Thoracic and Lumbar Region  No depletion  Buccal Region  Mild depletion  Temple Region  Moderate depletion  Clavicle Bone Region  Mild depletion  Clavicle and Acromion Bone Region  No depletion  Scapular Bone Region  Unable to assess  Dorsal Hand  Mild depletion  Patellar Region  No depletion  Anterior Thigh Region  No depletion  Posterior Calf Region  No depletion  Edema (RD Assessment)  Severe [deep  pitting RLE, moderate pitting LLE]  Hair  Reviewed  Eyes  Reviewed  Mouth  Reviewed  Skin  Reviewed  Nails  Reviewed       Diet Order:  Diet Carb Modified Fluid consistency: Thin; Room service appropriate? Yes  EDUCATION NEEDS:   No education needs have been identified at this time  Skin:  Skin Assessment: Reviewed RN Assessment  Last BM:  11/8- prior admission  Height:   Ht Readings from Last 1 Encounters:  10/10/16 5' 6.5" (1.689 m)    Weight:   Wt Readings from Last 1 Encounters:  04/17/17 148 lb 14.4 oz (67.5 kg)    Ideal Body Weight:  65.9 kg  BMI:  There is no height or weight on file to calculate BMI.  Estimated Nutritional Needs:   Kcal:  1650-1850 kcals  Protein:  85-95 grams   Fluid:  1.6-1.8 [place] Dietetic Intern Pager: [phone] 04/24/2017 11:14 AM

## 2017-04-24 NOTE — Progress Notes (Signed)
Orthopedic Tech Progress Note Patient Details:  Carlos Schultz Oct 23, 1940 [id number]  Musculoskeletal Traction Type of Traction: [place] Skin Traction Traction Location: re-applied Traction to pt right leg with 5lbs of weight.  Traction Weight: 5 lbs    [name] 04/24/2017, 3:28 PM

## 2017-04-24 NOTE — Progress Notes (Signed)
Orthopedic Tech Progress Note Patient Details:  Carlos Schultz 04/25/1941 [id number]  Musculoskeletal Traction Type of Traction: [place] Skin Traction Traction Location: Removed Traction so pt can be transported to CT Traction Weight: 5 lbs    [name] 04/24/2017, 1:45 PM
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
## 2017-04-24 NOTE — Progress Notes (Signed)
Dr. Broadus John paged twice about patient's CBG results. Awaiting call back.

## 2017-04-24 NOTE — ED Provider Notes (Signed)
Medical screening examination/treatment/procedure(s) were conducted as a shared visit with non-physician practitioner(s) and myself.  I personally evaluated the patient during the encounter.   EKG Interpretation None      76 year old male who presents after unwitnessed fall. History of dementia. Not anticoagulated. History of dementia and unable to provide history. Right leg is shortened and rotated. Neurovascularly in tact. X-ray hip visualized, and shows right intertrochanteric hip fracture. Orthopedic surgery consulted. To be admitted to hospitalist service.    Forde Dandy, MD 04/24/17 [phone]
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
## 2017-04-24 NOTE — ED Notes (Signed)
[facility] called or transport ans they can't promise he will be transported before shift change

## 2017-04-24 NOTE — Progress Notes (Signed)
Orthopedic Tech Progress Note Patient Details:  Carlos Schultz 1940-07-21 [id number]  Musculoskeletal Traction Type of Traction: Bucks Skin Traction Traction Location: rle Traction Weight: 5 lbs    [name] 04/24/2017, 11:06 AM

## 2017-04-25 ENCOUNTER — Encounter (HOSPITAL_COMMUNITY): Payer: Self-pay | Admitting: Surgery

## 2017-04-25 ENCOUNTER — Inpatient Hospital Stay (HOSPITAL_COMMUNITY): Payer: Medicare Other

## 2017-04-25 ENCOUNTER — Encounter (HOSPITAL_COMMUNITY)
Admission: EM | Disposition: A | Discharge status: Skilled Nursing Facility | Payer: Self-pay | Source: Home / Self Care | Attending: Internal Medicine

## 2017-04-25 ENCOUNTER — Inpatient Hospital Stay (HOSPITAL_COMMUNITY): Payer: Medicare Other | Admitting: Certified Registered Nurse Anesthetist

## 2017-04-25 DIAGNOSIS — R413 Other amnesia: Secondary | ICD-10-CM

## 2017-04-25 DIAGNOSIS — D649 Anemia, unspecified: Secondary | ICD-10-CM

## 2017-04-25 DIAGNOSIS — I10 Essential (primary) hypertension: Secondary | ICD-10-CM

## 2017-04-25 HISTORY — PX: INTRAMEDULLARY (IM) NAIL INTERTROCHANTERIC: SHX5875

## 2017-04-25 LAB — BASIC METABOLIC PANEL
Anion gap: 9 (ref 5–15)
BUN: 15 mg/dL (ref 6–20)
CO2: 22 mmol/L (ref 22–32)
Calcium: 9 mg/dL (ref 8.9–10.3)
Chloride: 101 mmol/L (ref 101–111)
Creatinine, Ser: 1.01 mg/dL (ref 0.61–1.24)
GLUCOSE: 171 mg/dL — AB (ref 65–99)
POTASSIUM: 4.2 mmol/L (ref 3.5–5.1)
SODIUM: 132 mmol/L — AB (ref 135–145)

## 2017-04-25 LAB — GLUCOSE, CAPILLARY
GLUCOSE-CAPILLARY: 207 mg/dL — AB (ref 65–99)
GLUCOSE-CAPILLARY: 110 mg/dL — AB (ref 65–99)
GLUCOSE-CAPILLARY: 143 mg/dL — AB (ref 65–99)
Glucose-Capillary: 122 mg/dL — ABNORMAL HIGH (ref 65–99)
Glucose-Capillary: 130 mg/dL — ABNORMAL HIGH (ref 65–99)

## 2017-04-25 LAB — CBC
HEMATOCRIT: 27.8 % — AB (ref 39.0–52.0)
HEMOGLOBIN: 9.2 g/dL — AB (ref 13.0–17.0)
MCH: 28.4 pg (ref 26.0–34.0)
MCHC: 33.1 g/dL (ref 30.0–36.0)
MCV: 85.8 fL (ref 78.0–100.0)
Platelets: 264 10*3/uL (ref 150–400)
RBC: 3.24 MIL/uL — AB (ref 4.22–5.81)
RDW: 14.4 % (ref 11.5–15.5)
WBC: 7.6 10*3/uL (ref 4.0–10.5)

## 2017-04-25 SURGERY — FIXATION, FRACTURE, INTERTROCHANTERIC, WITH INTRAMEDULLARY ROD
Anesthesia: General | Site: Hip | Laterality: Right

## 2017-04-25 MED ORDER — HYDROCODONE-ACETAMINOPHEN 5-325 MG PO TABS: 1.0000 | ORAL_TABLET | ORAL | 0 refills | Status: DC | PRN

## 2017-04-25 MED ORDER — CEFAZOLIN SODIUM 1 G IJ SOLR
INTRAMUSCULAR | Status: AC
  Filled 2017-04-25: qty 20

## 2017-04-25 MED ORDER — ACETAMINOPHEN 650 MG RE SUPP: 650.0000 mg | RECTAL | Status: DC | PRN

## 2017-04-25 MED ORDER — METOCLOPRAMIDE HCL 5 MG/ML IJ SOLN: 5.0000 mg | Freq: Three times a day (TID) | INTRAMUSCULAR | Status: DC | PRN

## 2017-04-25 MED ORDER — ASPIRIN EC 81 MG PO TBEC
81.0000 mg | DELAYED_RELEASE_TABLET | Freq: Two times a day (BID) | ORAL | Status: DC
  Administered 2017-04-25 – 2017-04-27 (×4): 81 mg via ORAL
  Filled 2017-04-25 (×5): qty 1

## 2017-04-25 MED ORDER — ONDANSETRON HCL 4 MG/2ML IJ SOLN: 4.0000 mg | Freq: Four times a day (QID) | INTRAMUSCULAR | Status: DC | PRN

## 2017-04-25 MED ORDER — EPHEDRINE 5 MG/ML INJ
INTRAVENOUS | Status: AC
  Filled 2017-04-25: qty 10

## 2017-04-25 MED ORDER — SUCCINYLCHOLINE CHLORIDE 20 MG/ML IJ SOLN
INTRAMUSCULAR | Status: DC | PRN
  Administered 2017-04-25: 120 mg via INTRAVENOUS

## 2017-04-25 MED ORDER — ONDANSETRON HCL 4 MG PO TABS: 4.0000 mg | ORAL_TABLET | Freq: Four times a day (QID) | ORAL | Status: DC | PRN

## 2017-04-25 MED ORDER — HYDROCODONE-ACETAMINOPHEN 5-325 MG PO TABS: 2.0000 | ORAL_TABLET | ORAL | Status: DC | PRN

## 2017-04-25 MED ORDER — POVIDONE-IODINE 10 % EX SWAB: 2.0000 "application " | Freq: Once | CUTANEOUS | Status: DC

## 2017-04-25 MED ORDER — PHENYLEPHRINE HCL 10 MG/ML IJ SOLN
INTRAMUSCULAR | Status: DC | PRN
  Administered 2017-04-25: 80 ug via INTRAVENOUS

## 2017-04-25 MED ORDER — LIDOCAINE HCL (CARDIAC) 20 MG/ML IV SOLN
INTRAVENOUS | Status: DC | PRN
  Administered 2017-04-25: 80 mg via INTRAVENOUS

## 2017-04-25 MED ORDER — LACTATED RINGERS IV SOLN
INTRAVENOUS | Status: DC
  Administered 2017-04-25 (×2): via INTRAVENOUS

## 2017-04-25 MED ORDER — FENTANYL CITRATE (PF) 100 MCG/2ML IJ SOLN
INTRAMUSCULAR | Status: DC | PRN
  Administered 2017-04-25 (×2): 100 ug via INTRAVENOUS

## 2017-04-25 MED ORDER — PROPOFOL 10 MG/ML IV BOLUS
INTRAVENOUS | Status: DC | PRN
  Administered 2017-04-25: 120 mg via INTRAVENOUS

## 2017-04-25 MED ORDER — METOCLOPRAMIDE HCL 5 MG PO TABS: 5.0000 mg | ORAL_TABLET | Freq: Three times a day (TID) | ORAL | Status: DC | PRN

## 2017-04-25 MED ORDER — ACETAMINOPHEN 325 MG PO TABS
650.0000 mg | ORAL_TABLET | ORAL | Status: DC | PRN
  Administered 2017-04-25: 650 mg via ORAL
  Filled 2017-04-25 (×2): qty 2

## 2017-04-25 MED ORDER — HYDROCODONE-ACETAMINOPHEN 5-325 MG PO TABS: 1.0000 | ORAL_TABLET | ORAL | Status: DC | PRN

## 2017-04-25 MED ORDER — CEFAZOLIN SODIUM-DEXTROSE 1-4 GM/50ML-% IV SOLN
1.0000 g | Freq: Four times a day (QID) | INTRAVENOUS | Status: AC
  Administered 2017-04-25 – 2017-04-26 (×3): 1 g via INTRAVENOUS
  Filled 2017-04-25 (×3): qty 50

## 2017-04-25 MED ORDER — CEFAZOLIN SODIUM-DEXTROSE 2-4 GM/100ML-% IV SOLN
2.0000 g | INTRAVENOUS | Status: AC
  Administered 2017-04-25: 2 g via INTRAVENOUS
  Filled 2017-04-25: qty 100

## 2017-04-25 MED ORDER — 0.9 % SODIUM CHLORIDE (POUR BTL) OPTIME
TOPICAL | Status: DC | PRN
  Administered 2017-04-25: 1000 mL

## 2017-04-25 MED ORDER — CHLORHEXIDINE GLUCONATE 4 % EX LIQD: 60.0000 mL | Freq: Once | CUTANEOUS | Status: DC

## 2017-04-25 MED ORDER — FENTANYL CITRATE (PF) 250 MCG/5ML IJ SOLN
INTRAMUSCULAR | Status: AC
  Filled 2017-04-25: qty 5

## 2017-04-25 MED ORDER — ONDANSETRON HCL 4 MG/2ML IJ SOLN
INTRAMUSCULAR | Status: DC | PRN
  Administered 2017-04-25: 4 mg via INTRAVENOUS

## 2017-04-25 SURGICAL SUPPLY — 52 items
BIT DRILL FLUTED FEMUR 4.2/3 (BIT) ×2 IMPLANT
BLADE SURG 15 STRL LF DISP TIS (BLADE) ×1 IMPLANT
BLADE SURG 15 STRL SS (BLADE) ×3
BNDG COHESIVE 4X5 TAN NS LF (GAUZE/BANDAGES/DRESSINGS) ×3 IMPLANT
BNDG COHESIVE 6X5 TAN STRL LF (GAUZE/BANDAGES/DRESSINGS) IMPLANT
BNDG GAUZE ELAST 4 BULKY (GAUZE/BANDAGES/DRESSINGS) ×3 IMPLANT
COVER PERINEAL POST (MISCELLANEOUS) ×3 IMPLANT
COVER SURGICAL LIGHT HANDLE (MISCELLANEOUS) ×3 IMPLANT
DRAPE HALF SHEET 40X57 (DRAPES) IMPLANT
DRAPE INCISE IOBAN 66X45 STRL (DRAPES) ×2 IMPLANT
DRAPE STERI IOBAN 125X83 (DRAPES) ×3 IMPLANT
DRSG MEPILEX BORDER 4X4 (GAUZE/BANDAGES/DRESSINGS) ×3 IMPLANT
DRSG MEPILEX BORDER 4X8 (GAUZE/BANDAGES/DRESSINGS) ×3 IMPLANT
DRSG PAD ABDOMINAL 8X10 ST (GAUZE/BANDAGES/DRESSINGS) ×6 IMPLANT
DURAPREP 26ML APPLICATOR (WOUND CARE) ×3 IMPLANT
ELECT CAUTERY BLADE 6.4 (BLADE) ×3 IMPLANT
ELECT REM PT RETURN 9FT ADLT (ELECTROSURGICAL) ×3
ELECTRODE REM PT RTRN 9FT ADLT (ELECTROSURGICAL) ×1 IMPLANT
FACESHIELD WRAPAROUND (MASK) ×3 IMPLANT
FACESHIELD WRAPAROUND OR TEAM (MASK) ×1 IMPLANT
GAUZE SPONGE 4X4 12PLY STRL (GAUZE/BANDAGES/DRESSINGS) ×2 IMPLANT
GAUZE XEROFORM 1X8 LF (GAUZE/BANDAGES/DRESSINGS) ×2 IMPLANT
GAUZE XEROFORM 5X9 LF (GAUZE/BANDAGES/DRESSINGS) ×3 IMPLANT
GLOVE BIO SURGEON STRL SZ7.5 (GLOVE) ×1 IMPLANT
GLOVE BIOGEL PI IND STRL 7.0 (GLOVE) IMPLANT
GLOVE BIOGEL PI IND STRL 8 (GLOVE) ×1 IMPLANT
GLOVE BIOGEL PI INDICATOR 7.0 (GLOVE) ×2
GLOVE BIOGEL PI INDICATOR 8 (GLOVE) ×2
GLOVE SURG SS PI 7.0 STRL IVOR (GLOVE) ×2 IMPLANT
GLOVE SURG SS PI 7.5 STRL IVOR (GLOVE) ×2 IMPLANT
GOWN STRL REUS W/ TWL LRG LVL3 (GOWN DISPOSABLE) ×2 IMPLANT
GOWN STRL REUS W/ TWL XL LVL3 (GOWN DISPOSABLE) ×1 IMPLANT
GOWN STRL REUS W/TWL LRG LVL3 (GOWN DISPOSABLE) ×6
GOWN STRL REUS W/TWL XL LVL3 (GOWN DISPOSABLE) ×3
GUIDEWIRE 3.2X400 (WIRE) ×4 IMPLANT
KIT BASIN OR (CUSTOM PROCEDURE TRAY) ×3 IMPLANT
KIT ROOM TURNOVER OR (KITS) ×3 IMPLANT
LINER BOOT UNIVERSAL DISP (MISCELLANEOUS) ×3 IMPLANT
MANIFOLD NEPTUNE II (INSTRUMENTS) ×3 IMPLANT
NAIL TROCH FIX 10X235 RT 130 (Nail) ×2 IMPLANT
NS IRRIG 1000ML POUR BTL (IV SOLUTION) ×3 IMPLANT
PACK GENERAL/GYN (CUSTOM PROCEDURE TRAY) ×3 IMPLANT
PAD ARMBOARD 7.5X6 YLW CONV (MISCELLANEOUS) ×6 IMPLANT
PAD CAST 4YDX4 CTTN HI CHSV (CAST SUPPLIES) ×2 IMPLANT
PADDING CAST COTTON 4X4 STRL (CAST SUPPLIES) ×6
SCREW LOCKING 5.0MMX40MM (Screw) ×2 IMPLANT
SCREW TNFA 110MM HIP (Screw) ×3 IMPLANT
STAPLER VISISTAT 35W (STAPLE) ×3 IMPLANT
SUT MON AB 2-0 CT1 36 (SUTURE) ×3 IMPLANT
TOWEL OR 17X24 6PK STRL BLUE (TOWEL DISPOSABLE) ×3 IMPLANT
TOWEL OR 17X26 10 PK STRL BLUE (TOWEL DISPOSABLE) ×3 IMPLANT
WATER STERILE IRR 1000ML POUR (IV SOLUTION) ×3 IMPLANT

## 2017-04-25 NOTE — Progress Notes (Signed)
PROGRESS NOTE    Carlos Schultz  ZOX:[id number] DOB: Apr 08, 1941 DOA: 04/23/2017 PCP: Seward Carol, MD  Brief Narrative:76 year old male with moderate dementia, hypertension, CHF, type 2 diabetes, history of prostate cancer recently hospitalized with urinary tract infection and metabolic encephalopathy, went to [facility] SNF on 11/8. -Admitted following a mechanical fall and found to have right hip fracture and mild acute kidney injury  Assessment & Plan: 1.  Closed right hip fracture status post fall  -Ortho following -plan for OR today -needs DVT proph post op  2. Acute renal failure - -Improving with holding lasix and ACE -creatinine improved  3. HTN -stable continue amlodipine, ACE on hold  4. Dementia  -he is alert, awake, oriented to self and pleasantly confused -recently Completed treatment for Proteus UTI -CT head unremarkable -continue Namenda  5. Diabetes mellitus type 2 -holding metformin, SSI  6. History of prostate cancer. -resume Xtandi at discharge, FU with Oncology  DVT prophylaxis: SCDs in anticipation of surgery. Code Status: Full code. Family Communication: NO family at bedside this am Disposition Plan: Skilled nursing facility.   Consultants:   Ortho    Procedures:   Antimicrobials:    Subjective: Pleasantly confused, no issues overnight  Objective: Vitals:   04/24/17 1448 04/24/17 2145 04/25/17 0500 04/25/17 1338  BP: (!) 131/53 (!) 124/58 (!) 148/85 (!) 138/56  Pulse: 90 84 97 88  Resp: 16  16 18   Temp: 98 F (36.7 C) 98.5 F (36.9 C) 98.4 F (36.9 C) 98.1 F (36.7 C)  TempSrc: Oral Axillary Axillary Oral  SpO2: 100% 100% 100% 100%    Intake/Output Summary (Last 24 hours) at 04/25/2017 1456 Last data filed at 04/25/2017 1300 Gross per 24 hour  Intake 560 ml  Output -  Net 560 ml   There were no vitals filed for this visit.  Examination:  General exam: pleasantly confused male, no distress Respiratory system:  Clear to auscultation. Respiratory effort normal. Cardiovascular system: S1 & S2 heard, RRR Gastrointestinal system: Abdomen is nondistended, soft and nontender.Normal bowel sounds heard. Central nervous system:  No focal neurological deficits. confused Extremities: Right leg shortened and externally rotated in Buck's traction Skin: No rashes, lesions or ulcers Psychiatry: Pleasantly confused    Data Reviewed:   CBC: Recent Labs  Lab 04/23/17 2332 04/25/17 0517  WBC 9.3 7.6  NEUTROABS 6.8  --   HGB 9.4* 9.2*  HCT 28.7* 27.8*  MCV 86.2 85.8  PLT 296 811   Basic Metabolic Panel: Recent Labs  Lab 04/23/17 2332 04/25/17 0517  NA 132* 132*  K 4.1 4.2  CL 100* 101  CO2 21* 22  GLUCOSE 178* 171*  BUN 24* 15  CREATININE 1.42* 1.01  CALCIUM 9.1 9.0   GFR: CrCl cannot be calculated (Unknown ideal weight.). Liver Function Tests: Recent Labs  Lab 04/23/17 2332  AST 39  ALT 127*  ALKPHOS 61  BILITOT 1.4*  PROT 6.5  ALBUMIN 3.0*   No results for input(s): LIPASE, AMYLASE in the last 168 hours. No results for input(s): AMMONIA in the last 168 hours. Coagulation Profile: Recent Labs  Lab 04/23/17 2332  INR 1.07   Cardiac Enzymes: No results for input(s): CKTOTAL, CKMB, CKMBINDEX, TROPONINI in the last 168 hours. BNP (last 3 results) No results for input(s): PROBNP in the last 8760 hours. HbA1C: No results for input(s): HGBA1C in the last 72 hours. CBG: Recent Labs  Lab 04/24/17 1715 04/24/17 1746 04/24/17 2141 04/25/17 0643 04/25/17 1226  GLUCAP 56* 174* 194*  207* 110*   Lipid Profile: No results for input(s): CHOL, HDL, LDLCALC, TRIG, CHOLHDL, LDLDIRECT in the last 72 hours. Thyroid Function Tests: No results for input(s): TSH, T4TOTAL, FREET4, T3FREE, THYROIDAB in the last 72 hours. Anemia Panel: No results for input(s): VITAMINB12, FOLATE, FERRITIN, TIBC, IRON, RETICCTPCT in the last 72 hours. Urine analysis:    Component Value Date/Time    COLORURINE YELLOW 04/23/2017 2233   APPEARANCEUR HAZY (A) 04/23/2017 2233   LABSPEC 1.018 04/23/2017 2233   PHURINE 5.0 04/23/2017 2233   GLUCOSEU NEGATIVE 04/23/2017 2233   HGBUR NEGATIVE 04/23/2017 2233   BILIRUBINUR NEGATIVE 04/23/2017 2233   KETONESUR NEGATIVE 04/23/2017 2233   PROTEINUR NEGATIVE 04/23/2017 2233   UROBILINOGEN 0.2 05/24/2014 1200   NITRITE NEGATIVE 04/23/2017 2233   LEUKOCYTESUR SMALL (A) 04/23/2017 2233   Sepsis Labs: @LABRCNTIP (procalcitonin:4,lacticidven:4)  ) Recent Results (from the past 240 hour(s))  Surgical pcr screen     Status: None   Collection Time: 04/24/17  2:36 PM  Result Value Ref Range Status   MRSA, PCR NEGATIVE NEGATIVE Final   Staphylococcus aureus NEGATIVE NEGATIVE Final    Comment: (NOTE) The Xpert SA Assay (FDA approved for NASAL specimens in patients 25 years of age and older), is one component of a comprehensive surveillance program. It is not intended to diagnose infection nor to guide or monitor treatment.          Radiology Studies: Dg Chest 2 View  Result Date: 04/23/2017 CLINICAL DATA:  Fall, hip fracture EXAM: CHEST  2 VIEW COMPARISON:  04/13/2017 FINDINGS: No acute consolidation or pleural effusion. Cardiomediastinal silhouette within normal limits. Aortic atherosclerosis. Apices are obscured by the patient's chin and neck. No pneumothorax. Nodule versus nipple shadow left lower lung IMPRESSION: No active cardiopulmonary disease. Nodule versus nipple shadow left lower lung, short interval radiographic follow-up with nipple markers may be performed. Electronically Signed   By: Donavan Foil M.D.   On: 04/23/2017 23:48   Ct Head Wo Contrast  Result Date: 04/24/2017 CLINICAL DATA:  Status post unwitnessed fall. Altered level of consciousness. EXAM: CT HEAD WITHOUT CONTRAST TECHNIQUE: Contiguous axial images were obtained from the base of the skull through the vertex without intravenous contrast. COMPARISON:  CT of the head  performed 04/13/2017 FINDINGS: Brain: No evidence of acute infarction, hemorrhage, hydrocephalus, extra-axial collection or mass lesion/mass effect. Prominence of the ventricles and sulci reflects mild to moderate cortical volume loss. Mild cerebellar atrophy is noted. Scattered periventricular white matter change likely reflects small vessel ischemic microangiopathy. The brainstem and fourth ventricle are within normal limits. The basal ganglia are unremarkable in appearance. The cerebral hemispheres demonstrate grossly normal gray-white differentiation. No mass effect or midline shift is seen. Vascular: No hyperdense vessel or unexpected calcification. Skull: There is no evidence of fracture; visualized osseous structures are unremarkable in appearance. Sinuses/Orbits: The orbits are within normal limits. The paranasal sinuses and mastoid air cells are well-aerated. Other: No significant soft tissue abnormalities are seen. IMPRESSION: 1. No evidence of traumatic intracranial injury or fracture. 2. Mild to moderate cortical volume loss and scattered small vessel ischemic microangiopathy. Electronically Signed   By: Garald Balding M.D.   On: 04/24/2017 04:03   Ct Hip Right Wo Contrast  Result Date: 04/24/2017 CLINICAL DATA:  Right hip fracture. EXAM: CT OF THE RIGHT HIP WITHOUT CONTRAST TECHNIQUE: Multidetector CT imaging of the right hip was performed according to the standard protocol. Multiplanar CT image reconstructions were also generated. COMPARISON:  Right hip x-rays from yesterday. FINDINGS:  Bones/Joint/Cartilage Again seen is an acute, comminuted, impacted, and mildly displaced intertrochanteric fracture. Apex anterior angulation is not significantly changed. No additional fracture seen. No dislocation. Mild degenerative changes of the right hip joint. Osteopenia. Ligaments Suboptimally assessed by CT. Muscles and Tendons No focal abnormality. Soft tissues Small amount of edema and hematoma surrounding  the fracture site. Anasarca. IMPRESSION: 1. Unchanged comminuted, impacted, and mildly angulated right intertrochanteric fracture. Electronically Signed   By: Titus Dubin M.D.   On: 04/24/2017 14:51   Dg Hip Unilat W Or Wo Pelvis 1 View Right  Result Date: 04/23/2017 CLINICAL DATA:  Fall with hip fracture EXAM: DG HIP (WITH OR WITHOUT PELVIS) 1V RIGHT COMPARISON:  CT 01/15/2016 FINDINGS: Acute comminuted and displaced intertrochanteric fracture on the right with apex anterior angulation on the lateral view. Femoral head projects in joint. Moderate arthritis. Prostatic seeds. Surgical changes in the right lower quadrant with metallic fragment. IMPRESSION: Acute comminuted, displaced and angulated intertrochanteric fracture on the right. Electronically Signed   By: Donavan Foil M.D.   On: 04/23/2017 23:46        Scheduled Meds: . amLODipine  5 mg Oral Daily  . chlorhexidine  60 mL Topical Once  . chlorhexidine  60 mL Topical Once  . enzalutamide  160 mg Oral Daily  . feeding supplement (ENSURE ENLIVE)  237 mL Oral BID BM  . insulin aspart  0-9 Units Subcutaneous TID WC  . memantine  5 mg Oral BID  . multivitamin with minerals  1 tablet Oral Daily  . omega-3 acid ethyl esters  1 g Oral Daily  . povidone-iodine  2 application Topical Once   Continuous Infusions: .  ceFAZolin (ANCEF) IV       LOS: 1 day    Time spent: 54min    Domenic Polite, MD [facility] Page via [email or web address], password [email or web address] After 7PM please contact night-coverage  04/25/2017, 2:56 PM

## 2017-04-25 NOTE — Anesthesia Procedure Notes (Incomplete)
Procedures

## 2017-04-25 NOTE — Transfer of Care (Signed)
Immediate Anesthesia Transfer of Care Note  Patient: Carlos Schultz  Procedure(s) Performed: INTRAMEDULLARY (IM) NAIL INTERTROCHANTRIC (Right Hip)  Patient Location: PACU  Anesthesia Type:General  Level of Consciousness: awake and alert   Airway & Oxygen Therapy: Patient Spontanous Breathing and Patient connected to nasal cannula oxygen  Post-op Assessment: Report given to RN and Post -op Vital signs reviewed and stable  Post vital signs: Reviewed and stable  Last Vitals:  Vitals:   04/25/17 1338 04/25/17 1735  BP: (!) 138/56 130/68  Pulse: 88 87  Resp: 18 15  Temp: 36.7 C 36.5 C  SpO2: 100% 99%    Last Pain:  Vitals:   04/25/17 1338  TempSrc: Oral  PainSc:          Complications: No apparent anesthesia complications

## 2017-04-25 NOTE — Discharge Instructions (Signed)
Orthopedic discharge instructions:  Okay for full weightbearing as tolerated to the right leg. Maintain postoperative bandage for 2 weeks until your postoperative follow-up appointment. It is okay to shower with your postoperative bandage in place, do not submerge underwater. For the prevention of blood clots take an aspirin twice daily.  He should do this for 1 month, and then once daily for the next 2 weeks following the initial 1 month.

## 2017-04-25 NOTE — Plan of Care (Signed)
  Pain Managment: General experience of comfort will improve 04/25/2017 0158 - Progressing by Irish Lack, RN

## 2017-04-25 NOTE — Progress Notes (Signed)
Patient ID: Carlos Schultz, male   DOB: [birth year], 76 y.o.   MRN: [id number]   LOS: 1 day   Subjective: Demented   Objective: Vital signs in last 24 hours: Temp:  [98 F (36.7 C)-98.5 F (36.9 C)] 98.4 F (36.9 C) (11/16 0500) Pulse Rate:  [84-97] 97 (11/16 0500) Resp:  [16] 16 (11/16 0500) BP: (124-148)/(53-85) 148/85 (11/16 0500) SpO2:  [100 %] 100 % (11/16 0500) Last BM Date: (PTA)   Laboratory  CBC Recent Labs    04/23/17 2332 04/25/17 0517  WBC 9.3 7.6  HGB 9.4* 9.2*  HCT 28.7* 27.8*  PLT 296 264   BMET Recent Labs    04/23/17 2332 04/25/17 0517  NA 132* 132*  K 4.1 4.2  CL 100* 101  CO2 21* 22  GLUCOSE 178* 171*  BUN 24* 15  CREATININE 1.42* 1.01  CALCIUM 9.1 9.0     Physical Exam General appearance: no distress  RLE: In traction, still shortened and externally rotated, 2+ DP, moves toes spontaneously   Assessment/Plan: Right hip fx -- For ORIF this afternoon by Dr. Stann Mainland. Please keep NPO.    Lisette Abu, PA-C Orthopedic Surgery [phone] 04/25/2017

## 2017-04-25 NOTE — Brief Op Note (Signed)
04/25/2017  5:22 PM  PATIENT:  Carlos Schultz  76 y.o. male  PRE-OPERATIVE DIAGNOSIS:  closed right hip fx  POST-OPERATIVE DIAGNOSIS:  closed right hip fx  PROCEDURE:  Procedure(s): INTRAMEDULLARY (IM) NAIL INTERTROCHANTRIC (Right)  SURGEON:  Surgeon(s) and Role:    * Nicholes Stairs, MD - Primary  PHYSICIAN ASSISTANT:   ASSISTANTS: none   ANESTHESIA:   general  EBL:  200 mL   BLOOD ADMINISTERED:none  DRAINS: none   LOCAL MEDICATIONS USED:  NONE  SPECIMEN:  No Specimen  DISPOSITION OF SPECIMEN:  N/A  COUNTS:  YES  TOURNIQUET:  * No tourniquets in log *  DICTATION: .Note written in EPIC  PLAN OF CARE: Admit to inpatient   PATIENT DISPOSITION:  PACU - hemodynamically stable.   Delay start of Pharmacological VTE agent (>24hrs) due to surgical blood loss or risk of bleeding: not applicable

## 2017-04-25 NOTE — Clinical Social Work Note (Signed)
Clinical Social Work Assessment  Patient Details  Name: Carlos Schultz MRN: [id number] Date of Birth: Jul 30, 1940  Date of referral:                  Reason for consult:  Facility Placement                Permission sought to share information with:  Chartered certified accountant granted to share information::  Yes, Verbal Permission Granted  Name::     Programmer, applications::  SNF-[facility]   Relationship::  daughter  Contact Information:     Housing/Transportation Living arrangements for the past 2 months:  [place] of Information:  Adult Children Patient Interpreter Needed:  None Criminal Activity/Legal Involvement Pertinent to Current Situation/Hospitalization:  No - Comment as needed Significant Relationships:  Adult Children Lives with:  Facility Resident Do you feel safe going back to the place where you live?  Yes Need for family participation in patient care:  Yes (Comment)  Care giving concerns:  Pt is from [facility] facility and has been there for about a week before returning to hospital for impairment. Pt is confirmed as residing alone prior to SNF admission recently. Pt has memory loss, confusion and combative behaviors.  CSW contacted daughter who confirmed that dad resided alone before SNF placement at [facility] a week ago. She indicated that when he does return to [facility] and completes treatment they will work on a more permanent plan for housing for patient as it appears patient cannot reside alone again given his mental decline.   CSW contacted hospital liaison, Dedra for [facility] and she confirmed that patient was at their SNF for about 1 one and could return. They are familiar with his behaviors and are willing to take him back once he is medically stable.  Social Worker assessment / plan:  CSW will assist with disposition. Plan is for patient to return to [facility] when medically stable.  CSW will  complete FL2 once surgery complete and evaluation notes updated.  Employment status:  Retired Nurse, adult PT Recommendations:  [place] / Referral to community resources:  [place]  Patient/Family's Response to care:  Daughter appreciative that CSW contacted her and discussed the discharge plan. Daughter reports no issues or concerns thus far.  Patient/Family's Understanding of and Emotional Response to Diagnosis, Current Treatment, and Prognosis:  Daughter indicated that she will f/u with nurse/clinical team on patient's diagnosis, current treatment and prognosis.  CSW provided her name of RN on floor working with patient. CSW will continue to follow for disposition. Plan is for patient to return to [facility] when medically stable.  Emotional Assessment Appearance:  Appears stated age Attitude/Demeanor/Rapport:  (Cooperative) Affect (typically observed):  Accepting, Appropriate Orientation:  Oriented to Self Alcohol / Substance use:  Not Applicable Psych involvement (Current and /or in the community):  No (Comment)  Discharge Needs  Concerns to be addressed:  Care Coordination Readmission within the last 30 days:  Yes Current discharge risk:  Dependent with Mobility, Physical Impairment Barriers to Discharge:  No Barriers Identified   Normajean Baxter, LCSW 04/25/2017, 10:06 AM

## 2017-04-25 NOTE — Anesthesia Preprocedure Evaluation (Signed)
Anesthesia Evaluation  Patient identified by MRN, date of birth, ID band Patient awake    Reviewed: Allergy & Precautions, NPO status , Patient's Chart, lab work & pertinent test results  Airway Mallampati: II  TM Distance: >3 FB Neck ROM: Full    Dental no notable dental hx.    Pulmonary neg pulmonary ROS, former smoker,    Pulmonary exam normal breath sounds clear to auscultation       Cardiovascular hypertension, Pt. on medications + Past MI  Normal cardiovascular exam Rhythm:Regular Rate:Normal     Neuro/Psych PSYCHIATRIC DISORDERS negative neurological ROS     GI/Hepatic negative GI ROS, Neg liver ROS,   Endo/Other  negative endocrine ROSdiabetes, Poorly Controlled, Type 2, Insulin Dependent, Oral Hypoglycemic Agents  Renal/GU Renal InsufficiencyRenal disease  negative genitourinary   Musculoskeletal negative musculoskeletal ROS (+)   Abdominal   Peds negative pediatric ROS (+)  Hematology negative hematology ROS (+) anemia ,   Anesthesia Other Findings   Reproductive/Obstetrics negative OB ROS                             Anesthesia Physical  Anesthesia Plan  ASA: III  Anesthesia Plan: General   Post-op Pain Management:    Induction: Intravenous  PONV Risk Score and Plan: 2 and Ondansetron and Midazolam  Airway Management Planned: Oral ETT  Additional Equipment:   Intra-op Plan:   Post-operative Plan: Extubation in OR  Informed Consent: I have reviewed the patients History and Physical, chart, labs and discussed the procedure including the risks, benefits and alternatives for the proposed anesthesia with the patient or authorized representative who has indicated his/her understanding and acceptance.   Dental advisory given  Plan Discussed with: CRNA  Anesthesia Plan Comments:         Anesthesia Quick Evaluation

## 2017-04-25 NOTE — Anesthesia Procedure Notes (Addendum)
Procedure Name: Intubation Date/Time: 04/25/2017 4:23 PM Performed by: Gurtha Picker T, CRNA Pre-anesthesia Checklist: Patient identified, Emergency Drugs available, Suction available and Patient being monitored Patient Re-evaluated:Patient Re-evaluated prior to induction Oxygen Delivery Method: Circle system utilized Preoxygenation: Pre-oxygenation with 100% oxygen Induction Type: IV induction Ventilation: Mask ventilation without difficulty Laryngoscope Size: Miller and 3 Grade View: Grade I Tube type: Oral Tube size: 7.5 mm Number of attempts: 1 Airway Equipment and Method: Patient positioned with wedge pillow and Stylet Placement Confirmation: ETT inserted through vocal cords under direct vision,  positive ETCO2 and breath sounds checked- equal and bilateral Secured at: 23 cm Tube secured with: Tape Dental Injury: Teeth and Oropharynx as per pre-operative assessment

## 2017-04-25 NOTE — Op Note (Signed)
Date of Surgery: 04/25/2017  INDICATIONS: Carlos Schultz is a 76 y.o.-year-old male who sustained a right hip fracture.  Carlos Schultz sustained a unwitnessed fall at his significant.  He does have advanced dementia.  He presented to the emergency department where he was found to have a right intertrochanteric hip fracture.  He was recommended for operative fixation for early mobilization as well as early weightbearing.  The risks and benefits of the procedure discussed with the family prior to the procedure and all questions were answered; consent was obtained.  PREOPERATIVE DIAGNOSIS: right hip fracture   POSTOPERATIVE DIAGNOSIS: Same   PROCEDURE: Treatment of intertrochanteric, pertrochanteric, subtrochanteric fracture with intramedullary implant. CPT [phone]   SURGEON: Dannielle Karvonen. Stann Mainland, M.D.   ANESTHESIA: general   IV FLUIDS AND URINE: See anesthesia record   ESTIMATED BLOOD LOSS: 200 cc  IMPLANTS: Synthes TFN A 10 mm by 235 mm 110 mm compression screw 5 mm x 40 mm distal interlock  DRAINS: None.   COMPLICATIONS: None.   DESCRIPTION OF PROCEDURE: The patient was brought to the operating room and placed supine on the operating table. The patient's leg had been signed prior to the procedure. The patient had the anesthesia placed by the anesthesiologist. The prep verification and incision time-outs were performed to confirm that this was the correct patient, site, side and location. The patient had an SCD on the opposite lower extremity. The patient did receive antibiotics prior to the incision and was re-dosed during the procedure as needed at indicated intervals. The patient was positioned on the fracture table with the table in traction and internal rotation to reduce the hip. The well leg was placed in a scissor position and all bony prominences were well-padded. The patient had the lower extremity prepped and draped in the standard surgical fashion. The incision was made 4 finger  breadths superior to the greater trochanter. A guide pin was inserted into the tip of the greater trochanter under fluoroscopic guidance. An opening reamer was used to gain access to the femoral canal. The nail length was measured and inserted down the femoral canal to its proper depth. The appropriate version of insertion for the lag screw was found under fluoroscopy. A pin was inserted up the femoral neck through the jig. The length of the lag screw was then measured. The lag screw was inserted as near to center-center in the head as possible. The leg was taken out of traction, then the compression screw was used to compress across the fracture. Compression was visualized on serial xrays.   We next turned our attention to the distal interlocking screw.  This was placed through the drill guide of the nail inserter.  A small incision was made overlying the lateral thigh at the screw site, and a tonsil was used to disect down to bone.  A drill pass was made through the jig and across the nail through both cortices.  This was measured, and the appropriate screw was placed under hand power and found to have good bit.    The wound was copiously irrigated with saline and deep fascia closed with 0 Vicryl, the subcutaneous layer closed with 2.0 Monocryl l and the skin was reapproximated with staples. The wounds were cleaned and dried a final time and a sterile dressing was placed. The hip was taken through a range of motion at the end of the case under fluoroscopic imaging to visualize the approach-withdraw phenomenon and confirm implant length in the head. The patient  was then awakened from anesthesia and taken to the recovery room in stable condition. All counts were correct at the end of the case.   POSTOPERATIVE PLAN: The patient will be weight bearing as tolerated and will return in 2 weeks for staple removal and the patient will receive DVT prophylaxis based on other medications, activity level, and risk ratio  of bleeding to thrombosis.  Recommendation is for a twice daily aspirin for 1 month, and then once daily aspirin for a subsequent 2 weeks.   Geralynn Rile, [place] [phone] 5:27 PM

## 2017-04-25 NOTE — Anesthesia Postprocedure Evaluation (Signed)
Anesthesia Post Note  Patient: Carlos Schultz  Procedure(s) Performed: INTRAMEDULLARY (IM) NAIL INTERTROCHANTRIC (Right Hip)     Patient location during evaluation: PACU Anesthesia Type: General Level of consciousness: awake Pain management: pain level controlled Respiratory status: spontaneous breathing Cardiovascular status: stable Anesthetic complications: no    Last Vitals:  Vitals:   04/25/17 1338 04/25/17 1735  BP: (!) 138/56 130/68  Pulse: 88 87  Resp: 18 15  Temp: 36.7 C 36.5 C  SpO2: 100% 99%    Last Pain:  Vitals:   04/25/17 1338  TempSrc: Oral  PainSc:         RLE Motor Response: Other (Comment)(does not follow command; same pre-op) (04/25/17 1735) RLE Sensation: Other (Comment)(Does not follow command; same pre-op) (04/25/17 1735)      Tangy Drozdowski

## 2017-04-26 LAB — GLUCOSE, CAPILLARY
GLUCOSE-CAPILLARY: 201 mg/dL — AB (ref 65–99)
Glucose-Capillary: 183 mg/dL — ABNORMAL HIGH (ref 65–99)
Glucose-Capillary: 222 mg/dL — ABNORMAL HIGH (ref 65–99)
Glucose-Capillary: 249 mg/dL — ABNORMAL HIGH (ref 65–99)

## 2017-04-26 MED ORDER — ASPIRIN 81 MG PO TBEC: 81.0000 mg | DELAYED_RELEASE_TABLET | Freq: Two times a day (BID) | ORAL | 0 refills | Status: DC

## 2017-04-26 NOTE — Progress Notes (Signed)
Patient ID: Carlos Schultz, male   DOB: Apr 05, 1941, 76 y.o.   MRN: [id number]  PROGRESS NOTE    Carlos Schultz  [name]:[id number] DOB: Apr 19, 1941 DOA: 04/23/2017  PCP: Seward Carol, MD   Brief Narrative:   76 year old male with moderate dementia,hypertension, CHF, type 2 diabetes, history of prostate cancer recently hospitalized with urinary tract infection and metabolic encephalopathy, went to [facility] on 11/8. -Admitted following a mechanical fall and found to have right hip fracture and mild acute kidney injury    Assessment & Plan:   Principal Problem:   Closed right hip fracture (HCC) - Ct Hip Right - showed unchanged comminuted, impacted, and mildly angulated right intertrochanteric fracture - Hip x ray showed acute comminuted, displaced and angulated intertrochanteric fracture on the right - S/P intramedullary nail 04/25/2017 - Continue pain management effort and per ortho WBAT on RLE - SW help appreciated with SNF placement  - SCD's and aspirin for DVT prophylaxis   Active Problems: Acute renal failure - Holding lasix and ACEi - Cr WNL this am  Essential hypertension - Continue Norvasc   Dementia without behavioral disturbance - Continue memantine   - CT head without acute intracranial findings   Diabetes mellitus type 2 without complications - Continue SSI  History of prostate cancer. - On Xtandi  Dyslipidemia - Continue Omega 3 supplementation   Anemia of chronic disease - Hgb stable    DVT prophylaxis: SCD's, aspirin  Code Status: full code  Family Communication: no family at the bedside this am Disposition Plan: anticipate D/C by Monday once cleared by ortho   Consultants:   Orthopedic surgery, Dr. Wylene Simmer  Procedures:   Intramedullary nail intertrochanteric, right - 04/25/2017  Antimicrobials:   None    Subjective: No overnight events.   Objective: Vitals:   04/25/17 2040 04/26/17 0100 04/26/17 0620 04/26/17 0944    BP: 108/61 (!) 99/40 (!) 117/52 (!) 112/56  Pulse: 97 96 87 86  Resp:  14 15 16   Temp: 99.2 F (37.3 C) 99.8 F (37.7 C) 99.1 F (37.3 C) 99.3 F (37.4 C)  TempSrc: Axillary Axillary Axillary Oral  SpO2: 98% 98% 99% 100%    Intake/Output Summary (Last 24 hours) at 04/26/2017 1537 Last data filed at 04/26/2017 2353 Gross per 24 hour  Intake 640 ml  Output 550 ml  Net 90 ml   There were no vitals filed for this visit.  Examination:  General exam: Appears little restless, no distress  Respiratory system: Clear to auscultation. Respiratory effort normal. Cardiovascular system: S1 & S2 heard, Rate controlled  Gastrointestinal system: Abdomen is nondistended, soft and nontender. No organomegaly or masses felt. Normal bowel sounds heard. Central nervous system: Alert, no focal deficits  Extremities: Palpable pulses, tender over right leg Skin: No rashes, lesions or ulcers Psychiatry: Little restless, has mittens on   Data Reviewed: I have personally reviewed following labs and imaging studies  CBC: Recent Labs  Lab 04/23/17 2332 04/25/17 0517  WBC 9.3 7.6  NEUTROABS 6.8  --   HGB 9.4* 9.2*  HCT 28.7* 27.8*  MCV 86.2 85.8  PLT 296 614   Basic Metabolic Panel: Recent Labs  Lab 04/23/17 2332 04/25/17 0517  NA 132* 132*  K 4.1 4.2  CL 100* 101  CO2 21* 22  GLUCOSE 178* 171*  BUN 24* 15  CREATININE 1.42* 1.01  CALCIUM 9.1 9.0   GFR: CrCl cannot be calculated (Unknown ideal weight.). Liver Function Tests: Recent Labs  Lab 04/23/17 2332  AST 39  ALT 127*  ALKPHOS 61  BILITOT 1.4*  PROT 6.5  ALBUMIN 3.0*   No results for input(s): LIPASE, AMYLASE in the last 168 hours. No results for input(s): AMMONIA in the last 168 hours. Coagulation Profile: Recent Labs  Lab 04/23/17 2332  INR 1.07   Cardiac Enzymes: No results for input(s): CKTOTAL, CKMB, CKMBINDEX, TROPONINI in the last 168 hours. BNP (last 3 results) No results for input(s): PROBNP in the  last 8760 hours. HbA1C: No results for input(s): HGBA1C in the last 72 hours. CBG: Recent Labs  Lab 04/25/17 1518 04/25/17 1736 04/25/17 2226 04/26/17 0654 04/26/17 1213  GLUCAP 122* 130* 143* 201* 183*   Lipid Profile: No results for input(s): CHOL, HDL, LDLCALC, TRIG, CHOLHDL, LDLDIRECT in the last 72 hours. Thyroid Function Tests: No results for input(s): TSH, T4TOTAL, FREET4, T3FREE, THYROIDAB in the last 72 hours. Anemia Panel: No results for input(s): VITAMINB12, FOLATE, FERRITIN, TIBC, IRON, RETICCTPCT in the last 72 hours. Urine analysis:    Component Value Date/Time   COLORURINE YELLOW 04/23/2017 2233   APPEARANCEUR HAZY (A) 04/23/2017 2233   LABSPEC 1.018 04/23/2017 2233   PHURINE 5.0 04/23/2017 2233   GLUCOSEU NEGATIVE 04/23/2017 2233   HGBUR NEGATIVE 04/23/2017 2233   BILIRUBINUR NEGATIVE 04/23/2017 2233   KETONESUR NEGATIVE 04/23/2017 2233   PROTEINUR NEGATIVE 04/23/2017 2233   UROBILINOGEN 0.2 05/24/2014 1200   NITRITE NEGATIVE 04/23/2017 2233   LEUKOCYTESUR SMALL (A) 04/23/2017 2233   Sepsis Labs: @LABRCNTIP (procalcitonin:4,lacticidven:4)    Surgical pcr screen     Status: None   Collection Time: 04/24/17  2:36 PM  Result Value Ref Range Status   MRSA, PCR NEGATIVE NEGATIVE Final   Staphylococcus aureus NEGATIVE NEGATIVE Final      Radiology Studies: Dg Chest 2 View Result Date: 04/23/2017 No active cardiopulmonary disease. Nodule versus nipple shadow left lower lung, short interval radiographic follow-up with nipple markers may be performed.   Ct Head Wo Contrast Result Date: 04/24/2017 1. No evidence of traumatic intracranial injury or fracture. 2. Mild to moderate cortical volume loss and scattered small vessel ischemic microangiopathy.   Ct Hip Right Wo Contrast Result Date: 04/24/2017 1. Unchanged comminuted, impacted, and mildly angulated right intertrochanteric fracture.   Dg C-arm 1-60 Min Result Date: 04/25/2017 Intraoperative  fluoroscopic assistance provided during right hip surgery for femur fracture   Dg Hip Unilat W Or Wo Pelvis 1 View Right Result Date: 04/23/2017 Acute comminuted, displaced and angulated intertrochanteric fracture on the right.  Dg Hip Operative Unilat W Or W/o Pelvis Right Result Date: 04/25/2017 Intraoperative fluoroscopic assistance provided during right hip surgery for femur fracture    Scheduled Meds: . amLODipine  5 mg Oral Daily  . aspirin EC  81 mg Oral BID  . enzalutamide  160 mg Oral Daily  . feeding supplement   237 mL Oral BID BM  . insulin aspart  0-9 Units Subcutaneous TID WC  . memantine  5 mg Oral BID  . multivitamin with mi  1 tablet Oral Daily  . omega-3 acid ethyl   1 g Oral Daily   Continuous Infusions: . lactated ringers 10 mL/hr at 04/25/17 1521     LOS: 2 days    Time spent: 25 minutes  Greater than 50% of the time spent on counseling and coordinating the care.   Leisa Lenz, MD [facility] Pager [phone]  If 7PM-7AM, please contact night-coverage [email or web address] Password [facility] 04/26/2017, 3:37 PM

## 2017-04-26 NOTE — Progress Notes (Signed)
Subjective: 1 Day Post-Op Procedure(s) (LRB): INTRAMEDULLARY (IM) NAIL INTERTROCHANTRIC (Right) Patient reports pain as mild.    Objective: Vital signs in last 24 hours: Temp:  [97.7 F (36.5 C)-99.8 F (37.7 C)] 99.1 F (37.3 C) (11/17 0620) Pulse Rate:  [87-99] 87 (11/17 0620) Resp:  [14-20] 15 (11/17 0620) BP: (99-138)/(40-80) 117/52 (11/17 0620) SpO2:  [96 %-100 %] 99 % (11/17 0620)  Intake/Output from previous day: 11/16 0701 - 11/17 0700 In: 400 [I.V.:400] Out: 550 [Urine:350; Blood:200] Intake/Output this shift: Total I/O In: 240 [P.O.:240] Out: -   Recent Labs    04/23/17 2332 04/25/17 0517  HGB 9.4* 9.2*   Recent Labs    04/23/17 2332 04/25/17 0517  WBC 9.3 7.6  RBC 3.33* 3.24*  HCT 28.7* 27.8*  PLT 296 264   Recent Labs    04/23/17 2332 04/25/17 0517  NA 132* 132*  K 4.1 4.2  CL 100* 101  CO2 21* 22  BUN 24* 15  CREATININE 1.42* 1.01  GLUCOSE 178* 171*  CALCIUM 9.1 9.0   Recent Labs    04/23/17 2332  INR 1.07    PE:  elderly male resting comfortably.  Responds to questions but not oriented.  R hip dressed and dry.  wiggles toes.  1+ DP pulse.  Assessment/Plan: 1 Day Post-Op Procedure(s) (LRB): INTRAMEDULLARY (IM) NAIL INTERTROCHANTRIC (Right) WBAT on R LE.  Aspirin for DVT prophylaxis.  Wylene Simmer 04/26/2017, 9:10 AM

## 2017-04-26 NOTE — Evaluation (Signed)
Occupational Therapy Evaluation Patient Details Name: Carlos Schultz MRN: [id number] DOB: 01-23-1941 Today's Date: 04/26/2017    History of Present Illness Carlos Schultz is a 76 y.o. male with history of dementia, diabetes mellitus, hypertension who was admitted last week for altered mental status secondary to UTI and discharged to skilled nursing facility was brought to the ER the patient had a fall at the facility.  Patient has had 2 falls over the last 48 hours. Pt is now s/p IM nail right intertrochantric.   Clinical Impression   Pt previously living independently in apt prior to SNF stay. Pt is currently max +2 for bed mobility. Max +2 for transfers (sit to stand only) and mod A for UB ADL, max A to total for LB ADL. Please see OT problem list below. Pt will benefit from skilled OT in the acute setting and afterwards at SNF level to maximize safety and independence in ADL and functional transfers. Next session to attempt OOB transfer and re-test following commands for grooming.     Follow Up Recommendations  SNF;Supervision/Assistance - 24 hour    Equipment Recommendations  Other (comment)(defer to next venue)    Recommendations for Other Services       Precautions / Restrictions Precautions Precautions: Fall Restrictions Weight Bearing Restrictions: No RLE Weight Bearing: Weight bearing as tolerated      Mobility Bed Mobility Overal bed mobility: Needs Assistance Bed Mobility: Supine to Sit;Sit to Supine     Supine to sit: +2 for physical assistance;Max assist Sit to supine: Max assist;+2 for physical assistance   General bed mobility comments: +2 helicopter motion to transition to EOB  Transfers Overall transfer level: Needs assistance Equipment used: Rolling walker (2 wheeled) Transfers: Sit to/from Stand Sit to Stand: +2 physical assistance;Max assist         General transfer comment: sit to stand x 2 trials with RW. Pt able to stand statically with RW  and +2 mod assist approx 1 minute per trial. Pt with B feet shifted to the right and trunk flexed during stance. Unable to attept SPT.    Balance Overall balance assessment: Needs assistance Sitting-balance support: Feet supported;Single extremity supported Sitting balance-Leahy Scale: Poor Sitting balance - Comments: sitting EOB close min guard for safety, but able to sit without physical support in non-challenged environment   Standing balance support: Bilateral upper extremity supported Standing balance-Leahy Scale: Zero                             ADL either performed or assessed with clinical judgement   ADL Overall ADL's : Needs assistance/impaired Eating/Feeding: Minimal assistance   Grooming: Moderate assistance   Upper Body Bathing: Moderate assistance   Lower Body Bathing: Maximal assistance   Upper Body Dressing : Moderate assistance   Lower Body Dressing: Maximal assistance   Toilet Transfer: Maximal assistance;+2 for physical assistance;+2 for safety/equipment;Squat-pivot;BSC   Toileting- Clothing Manipulation and Hygiene: Maximal assistance;+2 for physical assistance;+2 for safety/equipment;Sit to/from stand Toileting - Clothing Manipulation Details (indicate cue type and reason): rear peri care performed by OT in standing       General ADL Comments: Pt struggled to follow commands for ADL, but was pleasant and enjoyed listening to music during sitting EOB. Pt was unable to bring legs safely underhimself and required significant support - so was unsafe to attempt transfer to chair or BSC at this time.      Vision  Perception     Praxis      Pertinent Vitals/Pain Pain Assessment: Faces Faces Pain Scale: Hurts a little bit Pain Location: RLE during mobility Pain Descriptors / Indicators: Guarding;Grimacing Pain Intervention(s): Monitored during session;Repositioned     Hand Dominance Right   Extremity/Trunk Assessment Upper  Extremity Assessment Upper Extremity Assessment: Overall WFL for tasks assessed(very strong grip)   Lower Extremity Assessment Lower Extremity Assessment: RLE deficits/detail;Generalized weakness RLE Deficits / Details: s/p IM nail RLE Coordination: decreased fine motor;decreased gross motor   Cervical / Trunk Assessment Cervical / Trunk Assessment: Kyphotic   Communication Communication Communication: Expressive difficulties;Receptive difficulties   Cognition Arousal/Alertness: Awake/alert Behavior During Therapy: Flat affect Overall Cognitive Status: No family/caregiver present to determine baseline cognitive functioning Area of Impairment: Memory;Attention;Orientation;Following commands;Safety/judgement;Awareness;Problem solving                 Orientation Level: Disoriented to;Place;Time;Situation Current Attention Level: Focused Memory: Decreased short-term memory Following Commands: Follows one step commands inconsistently Safety/Judgement: Decreased awareness of safety;Decreased awareness of deficits Awareness: Intellectual Problem Solving: Slow processing;Decreased initiation;Difficulty sequencing;Requires verbal cues;Requires tactile cues     General Comments  no family/caregivers present at this time    Exercises     Shoulder Instructions      Home Living Family/patient expects to be discharged to:: Skilled nursing facility Living Arrangements: Alone                               Additional Comments: Per chart, pt lived at home alone prior to previous admission earlier this month. But daughter is actively looking for more permenant residence for him where he could get more asistance      Prior Functioning/Environment Level of Independence: Independent        Comments: Prior to previous admission, Pt was independent and living alone in apt        OT Problem List: Decreased activity tolerance;Impaired balance (sitting and/or  standing);Decreased cognition;Decreased safety awareness;Decreased knowledge of use of DME or AE      OT Treatment/Interventions: Self-care/ADL training;DME and/or AE instruction;Therapeutic activities;Patient/family education    OT Goals(Current goals can be found in the care plan section) Acute Rehab OT Goals Patient Stated Goal: unable to state OT Goal Formulation: Patient unable to participate in goal setting ADL Goals Pt Will Perform Grooming: with supervision;sitting Pt Will Perform Lower Body Bathing: with min assist;with caregiver independent in assisting;with adaptive equipment;sitting/lateral leans Pt Will Perform Lower Body Dressing: with mod assist;with caregiver independent in assisting;sit to/from stand Pt Will Transfer to Toilet: with mod assist;stand pivot transfer;bedside commode Pt Will Perform Toileting - Clothing Manipulation and hygiene: with max assist;sit to/from stand;with caregiver independent in assisting Additional ADL Goal #1: Pt will perform bed mobility at min A level prior to engaging in ADL activity  OT Frequency: Min 2X/week   Barriers to D/C: Decreased caregiver support  Pt has supportive family, but lived alone previously       Co-evaluation PT/OT/SLP Co-Evaluation/Treatment: Yes Reason for Co-Treatment: Necessary to address cognition/behavior during functional activity;For patient/therapist safety;To address functional/ADL transfers PT goals addressed during session: Mobility/safety with mobility;Balance OT goals addressed during session: ADL's and self-care      AM-PAC PT "6 Clicks" Daily Activity     Outcome Measure Help from another person eating meals?: A Little Help from another person taking care of personal grooming?: A Lot Help from another person toileting, which includes using toliet, bedpan, or urinal?: A  Lot Help from another person bathing (including washing, rinsing, drying)?: Total Help from another person to put on and taking off  regular upper body clothing?: Total Help from another person to put on and taking off regular lower body clothing?: Total 6 Click Score: 10   End of Session Equipment Utilized During Treatment: Gait belt;Rolling walker;Oxygen Nurse Communication: Mobility status  Activity Tolerance: Patient tolerated treatment well Patient left: in bed;with call bell/phone within reach;with bed alarm set;with restraints reapplied  OT Visit Diagnosis: Unsteadiness on feet (R26.81);Muscle weakness (generalized) (M62.81);Other symptoms and signs involving cognitive function                Time: 3338-3291 OT Time Calculation (min): 25 min Charges:  OT General Charges $OT Visit: 1 Visit OT Evaluation $OT Eval Moderate Complexity: 1 Mod G-Codes:     Hulda Humphrey OTR/L [place] 04/26/2017, 12:04 PM

## 2017-04-26 NOTE — Evaluation (Signed)
Physical Therapy Evaluation Patient Details Name: Carlos Schultz MRN: [id number] DOB: Jan 16, 1941 Today's Date: 04/26/2017   History of Present Illness  Carlos Schultz is a 76 y.o. male with history of dementia, diabetes mellitus, hypertension who was admitted last week for altered mental status secondary to UTI and discharged to skilled nursing facility was brought to the ER the patient had a fall at the facility.  Patient has had 2 falls over the last 48 hours. Pt is now s/p IM nail right intertrochantric.  Clinical Impression  Pt admitted with above diagnosis. Pt currently with functional limitations due to the deficits listed below (see PT Problem List). On eval, pt required +2 max assist for bed mobility and sit to stand transfer. Unable to progress bed to recliner due to level of assist and pt cognitive status.  Pt will benefit from skilled PT to increase their independence and safety with mobility to allow discharge to the venue listed below.       Follow Up Recommendations SNF    Equipment Recommendations  Other (comment)(defer to next venue)    Recommendations for Other Services       Precautions / Restrictions Precautions Precautions: Fall Restrictions RLE Weight Bearing: Weight bearing as tolerated      Mobility  Bed Mobility Overal bed mobility: Needs Assistance Bed Mobility: Supine to Sit;Sit to Supine     Supine to sit: +2 for physical assistance;Max assist Sit to supine: Max assist;+2 for physical assistance   General bed mobility comments: +2 helicopter motion to transition to EOB  Transfers Overall transfer level: Needs assistance Equipment used: Rolling walker (2 wheeled) Transfers: Sit to/from Stand Sit to Stand: +2 physical assistance;Max assist         General transfer comment: sit to stand x 2 trials with RW. Pt able to stand statically with RW and +2 mod assist approx 1 minute per trial. Pt with B feet shifted to the right and trunk flexed  during stance. Unable to attept SPT.  Ambulation/Gait             General Gait Details: unable  Stairs            Wheelchair Mobility    Modified Rankin (Stroke Patients Only)       Balance Overall balance assessment: Needs assistance Sitting-balance support: Feet supported;Single extremity supported Sitting balance-Leahy Scale: Poor     Standing balance support: Bilateral upper extremity supported Standing balance-Leahy Scale: Zero                               Pertinent Vitals/Pain Pain Assessment: Faces Faces Pain Scale: Hurts a little bit Pain Location: RLE during mobility Pain Descriptors / Indicators: Guarding;Grimacing Pain Intervention(s): Monitored during session;Repositioned;Ice applied    Home Living Family/patient expects to be discharged to:: Skilled nursing facility                 Additional Comments: Per chart, pt lived at home alone prior to previous admission earlier this month.    Prior Function           Comments: per chart walks and able to hold conversation     Hand Dominance   Dominant Hand: Right    Extremity/Trunk Assessment                Communication      Cognition Arousal/Alertness: Awake/alert Behavior During Therapy: Flat affect Overall Cognitive Status: No family/caregiver  present to determine baseline cognitive functioning Area of Impairment: Memory;Attention;Orientation;Following commands;Safety/judgement;Awareness;Problem solving                 Orientation Level: Disoriented to;Place;Time;Situation Current Attention Level: Focused   Following Commands: Follows one step commands inconsistently Safety/Judgement: Decreased awareness of safety;Decreased awareness of deficits Awareness: Intellectual Problem Solving: Slow processing;Decreased initiation;Difficulty sequencing;Requires verbal cues;Requires tactile cues        General Comments      Exercises      Assessment/Plan    PT Assessment Patient needs continued PT services  PT Problem List Decreased strength;Decreased mobility;Decreased safety awareness;Decreased knowledge of precautions;Decreased coordination;Decreased activity tolerance;Decreased cognition;Pain;Decreased knowledge of use of DME;Decreased balance       PT Treatment Interventions DME instruction;Gait training;Functional mobility training;Therapeutic activities;Therapeutic exercise;Balance training;Cognitive remediation;Patient/family education    PT Goals (Current goals can be found in the Care Plan section)  Acute Rehab PT Goals Patient Stated Goal: unable to state PT Goal Formulation: Patient unable to participate in goal setting Time For Goal Achievement: 05/10/17 Potential to Achieve Goals: Fair    Frequency Min 3X/week   Barriers to discharge        Co-evaluation PT/OT/SLP Co-Evaluation/Treatment: Yes Reason for Co-Treatment: For patient/therapist safety;Necessary to address cognition/behavior during functional activity PT goals addressed during session: Mobility/safety with mobility;Balance         AM-PAC PT "6 Clicks" Daily Activity  Outcome Measure Difficulty turning over in bed (including adjusting bedclothes, sheets and blankets)?: Unable Difficulty moving from lying on back to sitting on the side of the bed? : Unable Difficulty sitting down on and standing up from a chair with arms (e.g., wheelchair, bedside commode, etc,.)?: Unable Help needed moving to and from a bed to chair (including a wheelchair)?: Total Help needed walking in hospital room?: Total Help needed climbing 3-5 steps with a railing? : Total 6 Click Score: 6    End of Session Equipment Utilized During Treatment: Gait belt Activity Tolerance: Patient tolerated treatment well Patient left: in bed;with call bell/phone within reach;with nursing/sitter in room;with SCD's reapplied;with bed alarm set;with restraints reapplied Nurse  Communication: Mobility status;Need for lift equipment PT Visit Diagnosis: Other abnormalities of gait and mobility (R26.89);Muscle weakness (generalized) (M62.81);History of falling (Z91.81);Pain Pain - Right/Left: Right Pain - part of body: Hip    Time: 4627-0350 PT Time Calculation (min) (ACUTE ONLY): 27 min   Charges:   PT Evaluation $PT Eval Moderate Complexity: 1 Mod     PT G Codes:        Lorrin Goodell, PT  Office # [phone] Pager [phone]   Lorriane Shire 04/26/2017, 10:48 AM

## 2017-04-26 NOTE — NC FL2 (Signed)
[place] LEVEL OF CARE SCREENING TOOL     IDENTIFICATION  Patient Name: Carlos Schultz Birthdate: 12/24/1940 Sex: male Admission Date (Current Location): 04/23/2017  [facility] and [place] Number:  Herbalist and Address:  [facility], [place] [address], [place], [place] [address]      Provider Number: [id number]  Attending Physician Name and Address:  Robbie Lis, MD  Relative Name and Phone Number:  [name], daughter, [phone]    Current Level of Care: Hospital Recommended Level of Care: [place] Prior Approval Number:    Date Approved/Denied:   PASRR Number: [id number]  Discharge Plan: SNF    Current Diagnoses: Patient Active Problem List   Diagnosis Date Noted  . Closed right hip fracture, initial encounter ([place]) 04/24/2017  . Hip fracture ([place]) 04/24/2017  . AKI (acute kidney injury) ([place])   . Essential hypertension 04/13/2017  . Elevated liver function tests 04/13/2017  . Acute metabolic encephalopathy 19/37/9024  . CKD (chronic kidney disease), stage III ([place]) 04/13/2017  . Prostate cancer ([place]) 10/10/2016  . Closed fracture of phalanx or phalanges of hand 05/25/2014  . UTI (urinary tract infection) 05/25/2014  . Cognitive disorder   . Hypoglycemia 05/23/2014  . Pedal edema 05/23/2014  . URINARY INCONTINENCE, URGE 01/03/2009  . FATIGUE 02/22/2008  . Memory loss 12/22/2007  . RENAL INSUFFICIENCY, CHRONIC 09/22/2007  . Type II diabetes mellitus with renal manifestations ([place]) 09/10/2007  . ANEMIA 09/10/2007  . SYNCOPE 09/10/2007  . HYPERLIPIDEMIA 04/02/2007  . HEMATURIA, GROSS 04/02/2007  . ADENOCARCINOMA, PROSTATE, HX OF 06/11/1995    Orientation RESPIRATION BLADDER Height & Weight     Self  Normal Incontinent Weight:   Height:     BEHAVIORAL SYMPTOMS/MOOD NEUROLOGICAL BOWEL NUTRITION STATUS  Other (Comment)(Confusion)   Incontinent Diet(See DC Summary)  AMBULATORY STATUS  COMMUNICATION OF NEEDS Skin   Extensive Assist Verbally Surgical wounds                       Personal Care Assistance Level of Assistance  Bathing, Feeding, Dressing Bathing Assistance: Maximum assistance Feeding assistance: Limited assistance Dressing Assistance: Maximum assistance     Functional Limitations Info  Sight, Hearing, Speech Sight Info: Adequate Hearing Info: Adequate Speech Info: Adequate    SPECIAL CARE FACTORS FREQUENCY  PT (By licensed PT), OT (By licensed OT)     PT Frequency: 5x/wk OT Frequency: 5x/wk            Contractures Contractures Info: Not present    Additional Factors Info  Code Status, Allergies, Insulin Sliding Scale, Psychotropic Code Status Info: Full code Allergies Info: No Known Allergies Psychotropic Info: Namenda Insulin Sliding Scale Info: Insulin 0-9 units 3x/day with meals       Current Medications (04/26/2017):  This is the current hospital active medication list Current Facility-Administered Medications  Medication Dose Route Frequency Provider Last Rate Last Dose  . acetaminophen (TYLENOL) tablet 650 mg  650 mg Oral Q4H PRN Nicholes Stairs, MD   650 mg at 04/25/17 2217   Or  . acetaminophen (TYLENOL) suppository 650 mg  650 mg Rectal Q4H PRN Nicholes Stairs, MD      . amLODipine (NORVASC) tablet 5 mg  5 mg Oral Daily Rise Patience, MD   5 mg at 04/26/17 1007  . aspirin EC tablet 81 mg  81 mg Oral BID Nicholes Stairs, MD   81 mg at 04/26/17 1007  .  docusate sodium (COLACE) capsule 100 mg  100 mg Oral BID PRN Rise Patience, MD      . enzalutamide Gillermina Phy) capsule 160 mg  160 mg Oral Daily Rise Patience, MD      . feeding supplement (ENSURE ENLIVE) (ENSURE ENLIVE) liquid 237 mL  237 mL Oral BID BM Domenic Polite, MD   237 mL at 04/26/17 1009  . hydrALAZINE (APRESOLINE) injection 10 mg  10 mg Intravenous Q4H PRN Rise Patience, MD      . HYDROcodone-acetaminophen  (NORCO/VICODIN) 5-325 MG per tablet 1 tablet  1 tablet Oral Q4H PRN Nicholes Stairs, MD      . HYDROcodone-acetaminophen (NORCO/VICODIN) 5-325 MG per tablet 1-2 tablet  1-2 tablet Oral Q6H PRN Rise Patience, MD   1 tablet at 04/24/17 4097  . HYDROcodone-acetaminophen (NORCO/VICODIN) 5-325 MG per tablet 2 tablet  2 tablet Oral Q4H PRN Nicholes Stairs, MD      . insulin aspart (novoLOG) injection 0-9 Units  0-9 Units Subcutaneous TID WC Rise Patience, MD   3 Units at 04/26/17 [phone]  . lactated ringers infusion   Intravenous Continuous Oleta Mouse, MD 10 mL/hr at 04/25/17 1521    . memantine (NAMENDA) tablet 5 mg  5 mg Oral BID Rise Patience, MD   5 mg at 04/26/17 1007  . metoCLOPramide (REGLAN) tablet 5-10 mg  5-10 mg Oral Q8H PRN Nicholes Stairs, MD       Or  . metoCLOPramide [facility]) injection 5-10 mg  5-10 mg Intravenous Q8H PRN Nicholes Stairs, MD      . morphine 4 MG/ML injection 0.52 mg  0.52 mg Intravenous Q2H PRN Domenic Polite, MD      . multivitamin with minerals tablet 1 tablet  1 tablet Oral Daily Rise Patience, MD   1 tablet at 04/26/17 1008  . omega-3 acid ethyl esters (LOVAZA) capsule 1 g  1 g Oral Daily Domenic Polite, MD   1 g at 04/25/17 0950  . ondansetron (ZOFRAN) tablet 4 mg  4 mg Oral Q6H PRN Nicholes Stairs, MD       Or  . ondansetron [facility]) injection 4 mg  4 mg Intravenous Q6H PRN Nicholes Stairs, MD         Discharge Medications: Please see discharge summary for a list of discharge medications.  Relevant Imaging Results:  Relevant Lab Results:   Additional Information SS#: [id number]  Geralynn Ochs, LCSW

## 2017-04-27 DIAGNOSIS — S72001A Fracture of unspecified part of neck of right femur, initial encounter for closed fracture: Secondary | ICD-10-CM

## 2017-04-27 LAB — CBC
HEMATOCRIT: 25.1 % — AB (ref 39.0–52.0)
Hemoglobin: 8 g/dL — ABNORMAL LOW (ref 13.0–17.0)
MCH: 27.3 pg (ref 26.0–34.0)
MCHC: 31.9 g/dL (ref 30.0–36.0)
MCV: 85.7 fL (ref 78.0–100.0)
PLATELETS: 315 10*3/uL (ref 150–400)
RBC: 2.93 MIL/uL — ABNORMAL LOW (ref 4.22–5.81)
RDW: 14 % (ref 11.5–15.5)
WBC: 8.1 10*3/uL (ref 4.0–10.5)

## 2017-04-27 LAB — BASIC METABOLIC PANEL
Anion gap: 8 (ref 5–15)
BUN: 20 mg/dL (ref 6–20)
CALCIUM: 9.1 mg/dL (ref 8.9–10.3)
CO2: 25 mmol/L (ref 22–32)
CREATININE: 1.22 mg/dL (ref 0.61–1.24)
Chloride: 101 mmol/L (ref 101–111)
GFR calc Af Amer: >60 mL/min (ref >60)
GFR, EST NON AFRICAN AMERICAN: 55 mL/min — AB (ref >60)
GLUCOSE: 324 mg/dL — AB (ref 65–99)
Potassium: 4.3 mmol/L (ref 3.5–5.1)
Sodium: 134 mmol/L — ABNORMAL LOW (ref 135–145)

## 2017-04-27 LAB — GLUCOSE, CAPILLARY
GLUCOSE-CAPILLARY: 157 mg/dL — AB (ref 65–99)
Glucose-Capillary: 314 mg/dL — ABNORMAL HIGH (ref 65–99)
Glucose-Capillary: 161 mg/dL — ABNORMAL HIGH (ref 65–99)

## 2017-04-27 MED ORDER — INSULIN ASPART 100 UNIT/ML ~~LOC~~ SOLN
0.0000 [IU] | Freq: Three times a day (TID) | SUBCUTANEOUS | Status: DC
  Administered 2017-04-27: 3 [IU] via SUBCUTANEOUS
  Administered 2017-04-27: 11 [IU] via SUBCUTANEOUS
  Administered 2017-04-27: 3 [IU] via SUBCUTANEOUS

## 2017-04-27 MED ORDER — INSULIN ASPART 100 UNIT/ML ~~LOC~~ SOLN
0.0000 [IU] | Freq: Every day | SUBCUTANEOUS | Status: DC
Start: 2017-04-27 — End: 2017-04-27

## 2017-04-27 MED ORDER — WHITE PETROLATUM EX OINT
TOPICAL_OINTMENT | CUTANEOUS | Status: AC
  Administered 2017-04-27: 15:00:00
  Filled 2017-04-27: qty 28.35

## 2017-04-27 NOTE — Progress Notes (Addendum)
Patient ID: Carlos Schultz, male   DOB: 1940-08-31, 76 y.o.   MRN: [id number] Subjective: 2 Days Post-Op Procedure(s) (LRB): INTRAMEDULLARY (IM) NAIL INTERTROCHANTRIC (Right)    Patient sleeping this am with hand mits in place indicating confusion.  No events reported.  Objective:   VITALS:   Vitals:   04/27/17 0007 04/27/17 0419  BP: 132/66 101/62  Pulse: 78 60  Resp: 17 12  Temp: (!) 97.4 F (36.3 C) 98.1 F (36.7 C)  SpO2: 99% 93%    Incision: dressing C/D/I, right hip  LABS Recent Labs    04/25/17 0517 04/27/17 0437  HGB 9.2* 8.0*  HCT 27.8* 25.1*  WBC 7.6 8.1  PLT 264 315    Recent Labs    04/25/17 0517 04/27/17 0437  NA 132* 134*  K 4.2 4.3  BUN 15 20  CREATININE 1.01 1.22  GLUCOSE 171* 324*    No results for input(s): LABPT, INR in the last 72 hours.   Assessment/Plan: 2 Days Post-Op Procedure(s) (LRB): INTRAMEDULLARY (IM) NAIL INTERTROCHANTRIC (Right)   Advance diet Up with therapy Discharge to SNF when stable medically Post op instructions per Stann [facility] RTC in 2 weeks

## 2017-04-27 NOTE — Clinical Social Work Placement (Signed)
Nurse to call report to [phone]   CLINICAL SOCIAL WORK PLACEMENT  NOTE  Date:  04/27/2017  Patient Details  Name: Carlos Schultz MRN: [id number] Date of Birth: 10/09/1940  Clinical Social Work is seeking post-discharge placement for this patient at the [place] level of care (*CSW will initial, date and re-position this form in  chart as items are completed):  Yes   Patient/family provided with [facility] Work Department's list of facilities offering this level of care within the geographic area requested by the patient (or if unable, by the patient's family).  Yes   Patient/family informed of their freedom to choose among providers that offer the needed level of care, that participate in Medicare, Medicaid or managed care program needed by the patient, have an available bed and are willing to accept the patient.  Yes   Patient/family informed of [facility]'s ownership interest in [facility] and [facility], as well as of the fact that they are under no obligation to receive care at these facilities.  PASRR submitted to EDS on       PASRR number received on       Existing PASRR number confirmed on 04/18/17     FL2 transmitted to all facilities in geographic area requested by pt/family on       FL2 transmitted to all facilities within larger geographic area on       Patient informed that his/her managed care company has contracts with or will negotiate with certain facilities, including the following:        Yes   Patient/family informed of bed offers received.  Patient [facility] bed at [facility]     Physician recommends and patient chooses bed at      Patient to be transferred to [facility] on  .  Patient to be transferred to facility by [facility]AR     Patient family notified on 04/27/17 of transfer.  Name of family member notified:  [facility]     PHYSICIAN       Additional Comment:     _______________________________________________ Geralynn Ochs, LCSW 04/27/2017, 4:45 PM

## 2017-04-27 NOTE — Discharge Summary (Signed)
Physician Discharge Summary  GROVE DEFINA IRW:[id number] DOB: 18-Jan-1941 DOA: 04/23/2017  PCP: Seward Carol, MD  Admit date: 04/23/2017 Discharge date: 04/27/2017  Admitted From: SNF Disposition:  SNF   Recommendations for Outpatient Follow-up:  1. Follow up with PCP in 1 week 2. Weight bear as tolerated, follow up with orthopedic surgery in 2 weeks for staple removal 3. Continue aspirin BID for 1 month, then QD for 2 weeks for DVT ppx   Discharge Condition: Stable CODE STATUS: Full  Diet recommendation: Carb modified   Brief/Interim Summary: HPI: Carlos Schultz is a 76 y.o. male with history of dementia, diabetes mellitus, hypertension who was admitted last week for altered mental status secondary to UTI and discharged to skilled nursing facility. He was brought to the ER after patient had a fall at the facility.  It was an unwitnessed fall.  As per the patient's son who also provided the history patient has had 2 falls over the last 48 hours.   ED Course: In the ER x-ray revealed right sided hip fracture and on-call orthopedic surgeon was consulted and orthopedic surgeon has requested transfer to [facility] reveal acute renal failure.  Interim: Patient underwent intramedullary nail fixation on 11/16. Cr returned back to baseline after holding lasix, ACEi.  Patient was evaluated by physical therapy and was recommended for skilled nursing facility again.  Discussed with son over the phone on day of discharge.   Discharge Diagnoses:  Principal Problem:   Closed right hip fracture, initial encounter [facility]) Active Problems:   Type II diabetes mellitus with renal manifestations (HCC)   ANEMIA   RENAL INSUFFICIENCY, CHRONIC   Memory loss   Prostate cancer [facility])   Essential hypertension   Hip fracture (HCC)   Closed right hip fracture after mechanical fall  - Ct Hip Right - showed unchanged comminuted, impacted, and mildly angulated right intertrochanteric  fracture - Hip x ray showed acute comminuted, displaced and angulated intertrochanteric fracture on the right - S/P intramedullary nail 04/25/2017 - Continue pain management effort and per ortho WBAT on RLE  - Continue aspirin BID for 1 month, then QD for 2 weeks for DVT ppx  - PT OT recommending SNF placement   AKI - Resolved. Resume home meds   Essential hypertension - Continue Norvasc  - BP stable   Dementia without behavioral disturbance - Continue memantine  - CT head without acute intracranial findings   Diabetes mellitus type 2 with hyperglycemia  - Continue SSI  History of prostate cancer - On Xtandi  Dyslipidemia - Continue Omega 3 supplementation   Anemia of chronic disease - Hgb stable     Discharge Instructions  Discharge Instructions    Call MD for:  difficulty breathing, headache or visual disturbances   Complete by:  As directed    Call MD for:  extreme fatigue   Complete by:  As directed    Call MD for:  hives   Complete by:  As directed    Call MD for:  persistant dizziness or light-headedness   Complete by:  As directed    Call MD for:  persistant nausea and vomiting   Complete by:  As directed    Call MD for:  redness, tenderness, or signs of infection (pain, swelling, redness, odor or green/yellow discharge around incision site)   Complete by:  As directed    Call MD for:  severe uncontrolled pain   Complete by:  As directed  Call MD for:  temperature >100.4   Complete by:  As directed    Diet Carb Modified   Complete by:  As directed    Increase activity slowly   Complete by:  As directed      Allergies as of 04/27/2017   No Known Allergies     Medication List    STOP taking these medications   ciprofloxacin 500 MG tablet Commonly known as:  CIPRO     TAKE these medications   amLODipine 5 MG tablet Commonly known as:  NORVASC Take 5 mg by mouth daily.   aspirin 81 MG EC tablet Take 1 tablet (81 mg total) 2 (two)  times daily by mouth.   benazepril 40 MG tablet Commonly known as:  LOTENSIN Take 40 mg by mouth daily.   CALCIUM + D3 PO Take 1 tablet daily by mouth.   docusate sodium 100 MG capsule Commonly known as:  COLACE Take 1 capsule (100 mg total) by mouth 2 (two) times daily. What changed:    when to take this  reasons to take this   Fish Oil 1000 MG Caps Take 1,000 mg daily by mouth.   furosemide 40 MG tablet Commonly known as:  LASIX Take 40 mg by mouth daily.   HUMALOG MIX 75/25 KWIKPEN (75-25) 100 UNIT/ML Kwikpen Generic drug:  Insulin Lispro Prot & Lispro Inject 10-20 Units 2 (two) times daily as needed into the skin (CBG >200).   HYDROcodone-acetaminophen 5-325 MG tablet Commonly known as:  NORCO/VICODIN Take 1 tablet every 4 (four) hours as needed by mouth for moderate pain.   memantine 5 MG tablet Commonly known as:  NAMENDA Take 5 mg 2 (two) times daily by mouth.   metFORMIN 500 MG tablet Commonly known as:  GLUCOPHAGE Take 500 mg by mouth 2 (two) times daily.   multivitamin with minerals Tabs tablet Take 1 tablet by mouth daily.   tetrahydrozoline 0.05 % ophthalmic solution Place 1 drop 2 (two) times daily into both eyes.   XTANDI 40 MG capsule Generic drug:  enzalutamide Take 160 mg daily by mouth.       Contact information for follow-up providers    Nicholes Stairs, MD. Schedule an appointment as soon as possible for a visit in 2 week(s).   Specialty:  Orthopedic Surgery Why:  For wound re-check, For suture removal Contact information: [address] [place] [address] [phone]        Seward Carol, MD. Schedule an appointment as soon as possible for a visit in 1 week(s).   Specialty:  Internal Medicine Contact information: [address] [place] [address] [phone]            Contact information for after-discharge care    Destination    [facility] Follow up.    Service:  Skilled Nursing Contact information: [place] [phone]                 No Known Allergies  Consultations:  Orthopedic surgery    Procedures/Studies: Dg Chest 2 View  Result Date: 04/23/2017 CLINICAL DATA:  Fall, hip fracture EXAM: CHEST  2 VIEW COMPARISON:  04/13/2017 FINDINGS: No acute consolidation or pleural effusion. Cardiomediastinal silhouette within normal limits. Aortic atherosclerosis. Apices are obscured by the patient's chin and neck. No pneumothorax. Nodule versus nipple shadow left lower lung IMPRESSION: No active cardiopulmonary disease. Nodule versus nipple shadow left lower lung, short interval  radiographic follow-up with nipple markers may be performed. Electronically Signed   By: Donavan Foil M.D.   On: 04/23/2017 23:48   Ct Head Wo Contrast  Result Date: 04/24/2017 CLINICAL DATA:  Status post unwitnessed fall. Altered level of consciousness. EXAM: CT HEAD WITHOUT CONTRAST TECHNIQUE: Contiguous axial images were obtained from the base of the skull through the vertex without intravenous contrast. COMPARISON:  CT of the head performed 04/13/2017 FINDINGS: Brain: No evidence of acute infarction, hemorrhage, hydrocephalus, extra-axial collection or mass lesion/mass effect. Prominence of the ventricles and sulci reflects mild to moderate cortical volume loss. Mild cerebellar atrophy is noted. Scattered periventricular white matter change likely reflects small vessel ischemic microangiopathy. The brainstem and fourth ventricle are within normal limits. The basal ganglia are unremarkable in appearance. The cerebral hemispheres demonstrate grossly normal gray-white differentiation. No mass effect or midline shift is seen. Vascular: No hyperdense vessel or unexpected calcification. Skull: There is no evidence of fracture; visualized osseous structures are unremarkable in appearance. Sinuses/Orbits: The orbits are  within normal limits. The paranasal sinuses and mastoid air cells are well-aerated. Other: No significant soft tissue abnormalities are seen. IMPRESSION: 1. No evidence of traumatic intracranial injury or fracture. 2. Mild to moderate cortical volume loss and scattered small vessel ischemic microangiopathy. Electronically Signed   By: Garald Balding M.D.   On: 04/24/2017 04:03   Ct Head Wo Contrast  Result Date: 04/13/2017 CLINICAL DATA:  Increased weakness and confusion; LSN yesterday morning. Pt baseline dementia, per ems typically walks and can hold a conversation and has been unable to do as well since yesterday AM) EXAM: CT HEAD WITHOUT CONTRAST TECHNIQUE: Contiguous axial images were obtained from the base of the skull through the vertex without intravenous contrast. COMPARISON:  05/23/2014 FINDINGS: Brain: No evidence of acute infarction, hemorrhage, hydrocephalus, extra-axial collection or mass lesion/mass effect. There is age appropriate ventricular and sulcal enlargement. Periventricular white matter hypoattenuation is noted consistent with mild chronic microvascular ischemic change. Vascular: No hyperdense vessel or unexpected calcification. Skull: Normal. Negative for fracture or focal lesion. Sinuses/Orbits: Globes and orbits are unremarkable. Visualized sinuses and mastoid air cells are clear. Other: None. IMPRESSION: 1. No acute intracranial abnormalities. 2. Age related volume loss. Mild chronic microvascular ischemic change. Electronically Signed   By: Lajean Manes M.D.   On: 04/13/2017 18:39   US Abdomen Complete  Result Date: 04/13/2017 CLINICAL DATA:  Elevated LFTs EXAM: ABDOMEN ULTRASOUND COMPLETE COMPARISON:  CT 01/15/2016, ultrasound 12/16/2009 FINDINGS: Gallbladder: Limited visibility due to bowel gas. No gallstones or wall thickening visualized. No sonographic Murphy sign noted by sonographer. Common bile duct: Diameter: 3.7 mm Liver: Increased echogenicity suggesting fatty change.  Heterogeneous echotexture. Portal vein is patent on color Doppler imaging with normal direction of blood flow towards the liver. IVC: No abnormality visualized. Pancreas: Not seen due to bowel gas Spleen: Size and appearance within normal limits. Right Kidney: Length: 8.1 cm. Echogenicity within normal limits. No mass or hydronephrosis visualized. Left Kidney: Length: 9.6 cm. Echogenicity within normal limits. No mass or hydronephrosis visualized. Abdominal aorta: Not well seen due to bowel gas. Other findings: None. IMPRESSION: 1. The study is limited by copious bowel gas with limited visibility of the pancreas, gallbladder, and aorta. 2. Negative for gallstones or biliary dilatation. 3. Heterogeneous echogenic liver suggesting fatty infiltration Electronically Signed   By: Donavan Foil M.D.   On: 04/13/2017 22:48   Ct Hip Right Wo Contrast  Result Date: 04/24/2017 CLINICAL DATA:  Right hip fracture. EXAM: CT  OF THE RIGHT HIP WITHOUT CONTRAST TECHNIQUE: Multidetector CT imaging of the right hip was performed according to the standard protocol. Multiplanar CT image reconstructions were also generated. COMPARISON:  Right hip x-rays from yesterday. FINDINGS: Bones/Joint/Cartilage Again seen is an acute, comminuted, impacted, and mildly displaced intertrochanteric fracture. Apex anterior angulation is not significantly changed. No additional fracture seen. No dislocation. Mild degenerative changes of the right hip joint. Osteopenia. Ligaments Suboptimally assessed by CT. Muscles and Tendons No focal abnormality. Soft tissues Small amount of edema and hematoma surrounding the fracture site. Anasarca. IMPRESSION: 1. Unchanged comminuted, impacted, and mildly angulated right intertrochanteric fracture. Electronically Signed   By: Titus Dubin M.D.   On: 04/24/2017 14:51   Dg Chest Portable 1 View  Result Date: 04/13/2017 CLINICAL DATA:  Confusion and weakness EXAM: PORTABLE CHEST 1 VIEW COMPARISON:   05/23/2014 FINDINGS: No acute pulmonary infiltrate, consolidation or pleural effusion is visualized. The cardiomediastinal silhouette is stable. Aortic atherosclerosis. No pneumothorax. IMPRESSION: No active disease. Electronically Signed   By: Donavan Foil M.D.   On: 04/13/2017 17:58   Dg C-arm 1-60 Min  Result Date: 04/25/2017 CLINICAL DATA:  Right hip pinning EXAM: OPERATIVE right HIP (WITH PELVIS IF PERFORMED) 4 VIEWS TECHNIQUE: Fluoroscopic spot image(s) were submitted for interpretation post-operatively. COMPARISON:  04/24/2017 FINDINGS: Four low resolution intraoperative spot views of the right hip are submitted. The images demonstrate intramedullary rod fixation of the right femur across intertrochanteric fracture. Total fluoroscopy time was 1 minutes 11 seconds IMPRESSION: Intraoperative fluoroscopic assistance provided during right hip surgery for femur fracture Electronically Signed   By: Donavan Foil M.D.   On: 04/25/2017 19:10   Dg Hip Unilat W Or Wo Pelvis 1 View Right  Result Date: 04/23/2017 CLINICAL DATA:  Fall with hip fracture EXAM: DG HIP (WITH OR WITHOUT PELVIS) 1V RIGHT COMPARISON:  CT 01/15/2016 FINDINGS: Acute comminuted and displaced intertrochanteric fracture on the right with apex anterior angulation on the lateral view. Femoral head projects in joint. Moderate arthritis. Prostatic seeds. Surgical changes in the right lower quadrant with metallic fragment. IMPRESSION: Acute comminuted, displaced and angulated intertrochanteric fracture on the right. Electronically Signed   By: Donavan Foil M.D.   On: 04/23/2017 23:46   Dg Hip Operative Unilat W Or W/o Pelvis Right  Result Date: 04/25/2017 CLINICAL DATA:  Right hip pinning EXAM: OPERATIVE right HIP (WITH PELVIS IF PERFORMED) 4 VIEWS TECHNIQUE: Fluoroscopic spot image(s) were submitted for interpretation post-operatively. COMPARISON:  04/24/2017 FINDINGS: Four low resolution intraoperative spot views of the right hip are  submitted. The images demonstrate intramedullary rod fixation of the right femur across intertrochanteric fracture. Total fluoroscopy time was 1 minutes 11 seconds IMPRESSION: Intraoperative fluoroscopic assistance provided during right hip surgery for femur fracture Electronically Signed   By: Donavan Foil M.D.   On: 04/25/2017 19:10      Discharge Exam: Vitals:   04/27/17 0007 04/27/17 0419  BP: 132/66 101/62  Pulse: 78 60  Resp: 17 12  Temp: (!) 97.4 F (36.3 C) 98.1 F (36.7 C)  SpO2: 99% 93%   Vitals:   04/26/17 2038 04/27/17 0007 04/27/17 0419 04/27/17 1139  BP: (!) 115/54 132/66 101/62   Pulse: 83 78 60   Resp: 15 17 12    Temp: 98.3 F (36.8 C) (!) 97.4 F (36.3 C) 98.1 F (36.7 C)   TempSrc: Oral Axillary Axillary   SpO2: 97% 99% 93%   Weight:    67.5 kg (148 lb 14.4 oz)  Height:  5\' 7"  (1.702 m)    General: Pt is alert, with dementia  Cardiovascular: S1/S2 +, no rubs, no gallops Respiratory: CTA bilaterally, no wheezing, no rhonchi Abdominal: Soft, NT, ND, bowel sounds + Extremities: no edema, no cyanosis    The results of significant diagnostics from this hospitalization (including imaging, microbiology, ancillary and laboratory) are listed below for reference.     Microbiology: Recent Results (from the past 240 hour(s))  Surgical pcr screen     Status: None   Collection Time: 04/24/17  2:36 PM  Result Value Ref Range Status   MRSA, PCR NEGATIVE NEGATIVE Final   Staphylococcus aureus NEGATIVE NEGATIVE Final    Comment: (NOTE) The Xpert SA Assay (FDA approved for NASAL specimens in patients 25 years of age and older), is one component of a comprehensive surveillance program. It is not intended to diagnose infection nor to guide or monitor treatment.      Labs: BNP (last 3 results) No results for input(s): BNP in the last 8760 hours. Basic Metabolic Panel: Recent Labs  Lab 04/23/17 2332 04/25/17 0517 04/27/17 0437  NA 132* 132* 134*  K 4.1  4.2 4.3  CL 100* 101 101  CO2 21* 22 25  GLUCOSE 178* 171* 324*  BUN 24* 15 20  CREATININE 1.42* 1.01 1.22  CALCIUM 9.1 9.0 9.1   Liver Function Tests: Recent Labs  Lab 04/23/17 2332  AST 39  ALT 127*  ALKPHOS 61  BILITOT 1.4*  PROT 6.5  ALBUMIN 3.0*   No results for input(s): LIPASE, AMYLASE in the last 168 hours. No results for input(s): AMMONIA in the last 168 hours. CBC: Recent Labs  Lab 04/23/17 2332 04/25/17 0517 04/27/17 0437  WBC 9.3 7.6 8.1  NEUTROABS 6.8  --   --   HGB 9.4* 9.2* 8.0*  HCT 28.7* 27.8* 25.1*  MCV 86.2 85.8 85.7  PLT 296 264 315   Cardiac Enzymes: No results for input(s): CKTOTAL, CKMB, CKMBINDEX, TROPONINI in the last 168 hours. BNP: Invalid input(s): POCBNP CBG: Recent Labs  Lab 04/26/17 0654 04/26/17 1213 04/26/17 1733 04/26/17 2116 04/27/17 0611  GLUCAP 201* 183* 222* 249* 314*   D-Dimer No results for input(s): DDIMER in the last 72 hours. Hgb A1c No results for input(s): HGBA1C in the last 72 hours. Lipid Profile No results for input(s): CHOL, HDL, LDLCALC, TRIG, CHOLHDL, LDLDIRECT in the last 72 hours. Thyroid function studies No results for input(s): TSH, T4TOTAL, T3FREE, THYROIDAB in the last 72 hours.  Invalid input(s): FREET3 Anemia work up No results for input(s): VITAMINB12, FOLATE, FERRITIN, TIBC, IRON, RETICCTPCT in the last 72 hours. Urinalysis    Component Value Date/Time   COLORURINE YELLOW 04/23/2017 2233   APPEARANCEUR HAZY (A) 04/23/2017 2233   LABSPEC 1.018 04/23/2017 2233   PHURINE 5.0 04/23/2017 2233   GLUCOSEU NEGATIVE 04/23/2017 2233   HGBUR NEGATIVE 04/23/2017 2233   BILIRUBINUR NEGATIVE 04/23/2017 2233   KETONESUR NEGATIVE 04/23/2017 2233   PROTEINUR NEGATIVE 04/23/2017 2233   UROBILINOGEN 0.2 05/24/2014 1200   NITRITE NEGATIVE 04/23/2017 2233   LEUKOCYTESUR SMALL (A) 04/23/2017 2233   Sepsis Labs Invalid input(s): PROCALCITONIN,  WBC,  LACTICIDVEN Microbiology Recent Results (from the  past 240 hour(s))  Surgical pcr screen     Status: None   Collection Time: 04/24/17  2:36 PM  Result Value Ref Range Status   MRSA, PCR NEGATIVE NEGATIVE Final   Staphylococcus aureus NEGATIVE NEGATIVE Final    Comment: (NOTE) The Xpert SA Assay (FDA approved for  NASAL specimens in patients 15 years of age and older), is one component of a comprehensive surveillance program. It is not intended to diagnose infection nor to guide or monitor treatment.      Time coordinating discharge: 40 minutes  SIGNED:  Dessa Phi, DO [facility] Pager [phone]  If 7PM-7AM, please contact night-coverage [email or web address] Password [email or web address] 04/27/2017, 12:16 PM

## 2017-04-27 NOTE — Progress Notes (Signed)
Report called to [facility] at [phone]. S/W Caryl Pina. Susie Cassette RN

## 2017-04-28 ENCOUNTER — Encounter (HOSPITAL_COMMUNITY): Payer: Self-pay | Admitting: Orthopedic Surgery

## 2017-08-05 ENCOUNTER — Encounter (HOSPITAL_COMMUNITY): Payer: Self-pay

## 2017-08-05 ENCOUNTER — Inpatient Hospital Stay (HOSPITAL_COMMUNITY)
Admission: EM | Admit: 2017-08-05 | Discharge: 2017-08-08 | DRG: 637 | Disposition: A | Discharge status: Skilled Nursing Facility | Payer: Medicare Other | Attending: Internal Medicine | Admitting: Internal Medicine

## 2017-08-05 ENCOUNTER — Other Ambulatory Visit: Payer: Self-pay

## 2017-08-05 ENCOUNTER — Emergency Department (HOSPITAL_COMMUNITY): Payer: Medicare Other

## 2017-08-05 DIAGNOSIS — L89890 Pressure ulcer of other site, unstageable: Secondary | ICD-10-CM | POA: Diagnosis not present

## 2017-08-05 DIAGNOSIS — F0150 Vascular dementia without behavioral disturbance: Secondary | ICD-10-CM | POA: Diagnosis present

## 2017-08-05 DIAGNOSIS — Z91018 Allergy to other foods: Secondary | ICD-10-CM | POA: Diagnosis not present

## 2017-08-05 DIAGNOSIS — Z8546 Personal history of malignant neoplasm of prostate: Secondary | ICD-10-CM

## 2017-08-05 DIAGNOSIS — R739 Hyperglycemia, unspecified: Secondary | ICD-10-CM | POA: Diagnosis present

## 2017-08-05 DIAGNOSIS — S46911A Strain of unspecified muscle, fascia and tendon at shoulder and upper arm level, right arm, initial encounter: Secondary | ICD-10-CM

## 2017-08-05 DIAGNOSIS — M19011 Primary osteoarthritis, right shoulder: Secondary | ICD-10-CM | POA: Diagnosis present

## 2017-08-05 DIAGNOSIS — L89110 Pressure ulcer of right upper back, unstageable: Secondary | ICD-10-CM | POA: Diagnosis not present

## 2017-08-05 DIAGNOSIS — Z7982 Long term (current) use of aspirin: Secondary | ICD-10-CM

## 2017-08-05 DIAGNOSIS — Z87891 Personal history of nicotine dependence: Secondary | ICD-10-CM

## 2017-08-05 DIAGNOSIS — Z515 Encounter for palliative care: Secondary | ICD-10-CM

## 2017-08-05 DIAGNOSIS — Z6821 Body mass index (BMI) 21.0-21.9, adult: Secondary | ICD-10-CM | POA: Diagnosis not present

## 2017-08-05 DIAGNOSIS — Z833 Family history of diabetes mellitus: Secondary | ICD-10-CM | POA: Diagnosis not present

## 2017-08-05 DIAGNOSIS — R4182 Altered mental status, unspecified: Secondary | ICD-10-CM | POA: Diagnosis present

## 2017-08-05 DIAGNOSIS — I129 Hypertensive chronic kidney disease with stage 1 through stage 4 chronic kidney disease, or unspecified chronic kidney disease: Secondary | ICD-10-CM | POA: Diagnosis present

## 2017-08-05 DIAGNOSIS — E1165 Type 2 diabetes mellitus with hyperglycemia: Principal | ICD-10-CM | POA: Diagnosis present

## 2017-08-05 DIAGNOSIS — E440 Moderate protein-calorie malnutrition: Secondary | ICD-10-CM | POA: Diagnosis present

## 2017-08-05 DIAGNOSIS — E11622 Type 2 diabetes mellitus with other skin ulcer: Secondary | ICD-10-CM | POA: Diagnosis present

## 2017-08-05 DIAGNOSIS — E1122 Type 2 diabetes mellitus with diabetic chronic kidney disease: Secondary | ICD-10-CM | POA: Diagnosis not present

## 2017-08-05 DIAGNOSIS — I252 Old myocardial infarction: Secondary | ICD-10-CM

## 2017-08-05 DIAGNOSIS — H1033 Unspecified acute conjunctivitis, bilateral: Secondary | ICD-10-CM | POA: Diagnosis present

## 2017-08-05 DIAGNOSIS — E1129 Type 2 diabetes mellitus with other diabetic kidney complication: Secondary | ICD-10-CM | POA: Diagnosis present

## 2017-08-05 DIAGNOSIS — G9341 Metabolic encephalopathy: Secondary | ICD-10-CM | POA: Diagnosis not present

## 2017-08-05 DIAGNOSIS — M75101 Unspecified rotator cuff tear or rupture of right shoulder, not specified as traumatic: Secondary | ICD-10-CM | POA: Diagnosis present

## 2017-08-05 DIAGNOSIS — G309 Alzheimer's disease, unspecified: Secondary | ICD-10-CM | POA: Diagnosis present

## 2017-08-05 DIAGNOSIS — Z794 Long term (current) use of insulin: Secondary | ICD-10-CM

## 2017-08-05 DIAGNOSIS — Z79891 Long term (current) use of opiate analgesic: Secondary | ICD-10-CM

## 2017-08-05 DIAGNOSIS — M109 Gout, unspecified: Secondary | ICD-10-CM | POA: Diagnosis present

## 2017-08-05 DIAGNOSIS — D649 Anemia, unspecified: Secondary | ICD-10-CM | POA: Diagnosis present

## 2017-08-05 DIAGNOSIS — Z8781 Personal history of (healed) traumatic fracture: Secondary | ICD-10-CM

## 2017-08-05 DIAGNOSIS — L8990 Pressure ulcer of unspecified site, unspecified stage: Secondary | ICD-10-CM

## 2017-08-05 DIAGNOSIS — E871 Hypo-osmolality and hyponatremia: Secondary | ICD-10-CM | POA: Diagnosis not present

## 2017-08-05 DIAGNOSIS — I2510 Atherosclerotic heart disease of native coronary artery without angina pectoris: Secondary | ICD-10-CM | POA: Diagnosis not present

## 2017-08-05 DIAGNOSIS — E11649 Type 2 diabetes mellitus with hypoglycemia without coma: Secondary | ICD-10-CM | POA: Diagnosis present

## 2017-08-05 DIAGNOSIS — Z79899 Other long term (current) drug therapy: Secondary | ICD-10-CM

## 2017-08-05 DIAGNOSIS — Z7189 Other specified counseling: Secondary | ICD-10-CM

## 2017-08-05 DIAGNOSIS — F0280 Dementia in other diseases classified elsewhere without behavioral disturbance: Secondary | ICD-10-CM | POA: Diagnosis not present

## 2017-08-05 DIAGNOSIS — N182 Chronic kidney disease, stage 2 (mild): Secondary | ICD-10-CM | POA: Diagnosis not present

## 2017-08-05 DIAGNOSIS — E785 Hyperlipidemia, unspecified: Secondary | ICD-10-CM | POA: Diagnosis present

## 2017-08-05 LAB — URINALYSIS, ROUTINE W REFLEX MICROSCOPIC
BILIRUBIN URINE: NEGATIVE
Bacteria, UA: NONE SEEN
HGB URINE DIPSTICK: NEGATIVE
Ketones, ur: NEGATIVE mg/dL
Leukocytes, UA: NEGATIVE
NITRITE: NEGATIVE
PH: 6 (ref 5.0–8.0)
Protein, ur: NEGATIVE mg/dL
Specific Gravity, Urine: 1.018 (ref 1.005–1.030)
Squamous Epithelial / LPF: NONE SEEN

## 2017-08-05 LAB — BASIC METABOLIC PANEL
ANION GAP: 11 (ref 5–15)
BUN: 25 mg/dL — ABNORMAL HIGH (ref 6–20)
CALCIUM: 9.5 mg/dL (ref 8.9–10.3)
CO2: 25 mmol/L (ref 22–32)
CREATININE: 1.26 mg/dL — AB (ref 0.61–1.24)
Chloride: 93 mmol/L — ABNORMAL LOW (ref 101–111)
GFR, EST NON AFRICAN AMERICAN: 53 mL/min — AB (ref >60)
Glucose, Bld: 637 mg/dL (ref 65–99)
Potassium: 4.4 mmol/L (ref 3.5–5.1)
SODIUM: 129 mmol/L — AB (ref 135–145)

## 2017-08-05 LAB — CBC WITH DIFFERENTIAL/PLATELET
BASOS ABS: 0 10*3/uL (ref 0.0–0.1)
BASOS PCT: 0 %
EOS ABS: 0 10*3/uL (ref 0.0–0.7)
Eosinophils Relative: 0 %
HCT: 30 % — ABNORMAL LOW (ref 39.0–52.0)
Hemoglobin: 9.4 g/dL — ABNORMAL LOW (ref 13.0–17.0)
Lymphocytes Relative: 19 %
Lymphs Abs: 1.3 10*3/uL (ref 0.7–4.0)
MCH: 26.6 pg (ref 26.0–34.0)
MCHC: 31.3 g/dL (ref 30.0–36.0)
MCV: 84.7 fL (ref 78.0–100.0)
MONO ABS: 0.4 10*3/uL (ref 0.1–1.0)
MONOS PCT: 5 %
NEUTROS PCT: 76 %
Neutro Abs: 5.2 10*3/uL (ref 1.7–7.7)
Platelets: 312 10*3/uL (ref 150–400)
RBC: 3.54 MIL/uL — ABNORMAL LOW (ref 4.22–5.81)
RDW: 14.1 % (ref 11.5–15.5)
WBC: 6.9 10*3/uL (ref 4.0–10.5)

## 2017-08-05 LAB — I-STAT VENOUS BLOOD GAS, ED
Acid-Base Excess: 8 mmol/L — ABNORMAL HIGH (ref 0.0–2.0)
BICARBONATE: 33.2 mmol/L — AB (ref 20.0–28.0)
O2 SAT: 78 %
PCO2 VEN: 46 mmHg (ref 44.0–60.0)
PO2 VEN: 41 mmHg (ref 32.0–45.0)
TCO2: 35 mmol/L — AB (ref 22–32)
pH, Ven: 7.466 — ABNORMAL HIGH (ref 7.250–7.430)

## 2017-08-05 LAB — CBG MONITORING, ED: GLUCOSE-CAPILLARY: 557 mg/dL — AB (ref 65–99)

## 2017-08-05 LAB — TROPONIN I: Troponin I: <0.03 ng/mL (ref <0.03)

## 2017-08-05 MED ORDER — DOCUSATE SODIUM 100 MG PO CAPS: 100.0000 mg | ORAL_CAPSULE | Freq: Two times a day (BID) | ORAL | Status: DC | PRN

## 2017-08-05 MED ORDER — SODIUM CHLORIDE 0.9 % IV SOLN
INTRAVENOUS | Status: DC
  Administered 2017-08-06: 01:00:00 via INTRAVENOUS

## 2017-08-05 MED ORDER — FUROSEMIDE 40 MG PO TABS
40.0000 mg | ORAL_TABLET | Freq: Every day | ORAL | Status: DC
  Administered 2017-08-06 – 2017-08-08 (×3): 40 mg via ORAL
  Filled 2017-08-05 (×4): qty 1

## 2017-08-05 MED ORDER — ENOXAPARIN SODIUM 40 MG/0.4ML ~~LOC~~ SOLN
40.0000 mg | SUBCUTANEOUS | Status: DC
  Administered 2017-08-06 – 2017-08-08 (×3): 40 mg via SUBCUTANEOUS
  Filled 2017-08-05 (×3): qty 0.4

## 2017-08-05 MED ORDER — NAPHAZOLINE-GLYCERIN 0.012-0.2 % OP SOLN
1.0000 [drp] | Freq: Two times a day (BID) | OPHTHALMIC | Status: DC
  Administered 2017-08-06 – 2017-08-08 (×5): 1 [drp] via OPHTHALMIC
  Filled 2017-08-05: qty 15

## 2017-08-05 MED ORDER — INSULIN ASPART 100 UNIT/ML ~~LOC~~ SOLN
0.0000 [IU] | SUBCUTANEOUS | Status: DC
  Administered 2017-08-06: 1 [IU] via SUBCUTANEOUS
  Administered 2017-08-06 (×2): 3 [IU] via SUBCUTANEOUS
  Administered 2017-08-07: 1 [IU] via SUBCUTANEOUS
  Administered 2017-08-07: 3 [IU] via SUBCUTANEOUS
  Administered 2017-08-07: 2 [IU] via SUBCUTANEOUS
  Administered 2017-08-08: 3 [IU] via SUBCUTANEOUS
  Administered 2017-08-08: 9 [IU] via SUBCUTANEOUS
  Administered 2017-08-08: 1 [IU] via SUBCUTANEOUS

## 2017-08-05 MED ORDER — SODIUM CHLORIDE 0.9 % IV BOLUS (SEPSIS)
1000.0000 mL | Freq: Once | INTRAVENOUS | Status: AC
  Administered 2017-08-05: 1000 mL via INTRAVENOUS

## 2017-08-05 MED ORDER — POLYMYXIN B-TRIMETHOPRIM 10000-0.1 UNIT/ML-% OP SOLN
1.0000 [drp] | OPHTHALMIC | Status: DC
  Administered 2017-08-06 – 2017-08-08 (×10): 1 [drp] via OPHTHALMIC
  Filled 2017-08-05 (×2): qty 10

## 2017-08-05 MED ORDER — INSULIN ASPART 100 UNIT/ML ~~LOC~~ SOLN
10.0000 [IU] | Freq: Once | SUBCUTANEOUS | Status: AC
Start: 2017-08-05 — End: 2017-08-05
  Administered 2017-08-05: 10 [IU] via SUBCUTANEOUS
  Filled 2017-08-05: qty 1

## 2017-08-05 MED ORDER — BENAZEPRIL HCL 40 MG PO TABS
40.0000 mg | ORAL_TABLET | Freq: Every day | ORAL | Status: DC
  Administered 2017-08-06 – 2017-08-08 (×3): 40 mg via ORAL
  Filled 2017-08-05: qty 2
  Filled 2017-08-05: qty 1
  Filled 2017-08-05: qty 2
  Filled 2017-08-05 (×2): qty 1
  Filled 2017-08-05: qty 2

## 2017-08-05 MED ORDER — AMLODIPINE BESYLATE 5 MG PO TABS
5.0000 mg | ORAL_TABLET | Freq: Every day | ORAL | Status: DC
  Administered 2017-08-06 – 2017-08-08 (×3): 5 mg via ORAL
  Filled 2017-08-05 (×3): qty 1

## 2017-08-05 MED ORDER — ENZALUTAMIDE 40 MG PO CAPS: 160.0000 mg | ORAL_CAPSULE | Freq: Every day | ORAL | Status: DC

## 2017-08-05 MED ORDER — MEMANTINE HCL 5 MG PO TABS
5.0000 mg | ORAL_TABLET | Freq: Two times a day (BID) | ORAL | Status: DC
  Administered 2017-08-06 – 2017-08-08 (×5): 5 mg via ORAL
  Filled 2017-08-05 (×5): qty 1

## 2017-08-05 MED ORDER — ACETAMINOPHEN 650 MG RE SUPP: 650.0000 mg | Freq: Four times a day (QID) | RECTAL | Status: DC | PRN

## 2017-08-05 MED ORDER — ASPIRIN EC 81 MG PO TBEC
81.0000 mg | DELAYED_RELEASE_TABLET | Freq: Two times a day (BID) | ORAL | Status: DC
  Administered 2017-08-06 – 2017-08-08 (×5): 81 mg via ORAL
  Filled 2017-08-05 (×5): qty 1

## 2017-08-05 MED ORDER — METFORMIN HCL 500 MG PO TABS: 500.0000 mg | ORAL_TABLET | Freq: Two times a day (BID) | ORAL | Status: DC

## 2017-08-05 MED ORDER — ACETAMINOPHEN 325 MG PO TABS: 650.0000 mg | ORAL_TABLET | Freq: Four times a day (QID) | ORAL | Status: DC | PRN

## 2017-08-05 NOTE — ED Notes (Signed)
Patient transported to CT 
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
## 2017-08-05 NOTE — ED Notes (Signed)
Patient family member is asking to be called when patient gets room.  Loura Back [phone] Salome Spotted [phone]
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
## 2017-08-05 NOTE — ED Triage Notes (Signed)
Pt arrives from home with family with complaints of worsening confusion (hx of alzheimer's) and pts reporting irritation to bilateral eyes and neck pain. Pt endorses pain to right shoulder. Family reports bruising over entire body.Family reports pt is supposed to have 24 hr care but has not recently/

## 2017-08-05 NOTE — ED Notes (Signed)
CT notified pt is ready for scan 
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
## 2017-08-05 NOTE — ED Notes (Signed)
Attempted to call report
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
## 2017-08-05 NOTE — ED Notes (Signed)
Pharmacy messaged about unverified meds 

## 2017-08-06 ENCOUNTER — Inpatient Hospital Stay (HOSPITAL_COMMUNITY): Payer: Medicare Other

## 2017-08-06 DIAGNOSIS — F0390 Unspecified dementia without behavioral disturbance: Secondary | ICD-10-CM

## 2017-08-06 DIAGNOSIS — T383X5A Adverse effect of insulin and oral hypoglycemic [antidiabetic] drugs, initial encounter: Secondary | ICD-10-CM

## 2017-08-06 DIAGNOSIS — D649 Anemia, unspecified: Secondary | ICD-10-CM | POA: Diagnosis not present

## 2017-08-06 DIAGNOSIS — R4182 Altered mental status, unspecified: Secondary | ICD-10-CM | POA: Diagnosis not present

## 2017-08-06 DIAGNOSIS — R739 Hyperglycemia, unspecified: Secondary | ICD-10-CM | POA: Diagnosis not present

## 2017-08-06 DIAGNOSIS — N182 Chronic kidney disease, stage 2 (mild): Secondary | ICD-10-CM

## 2017-08-06 DIAGNOSIS — E440 Moderate protein-calorie malnutrition: Secondary | ICD-10-CM | POA: Diagnosis not present

## 2017-08-06 DIAGNOSIS — E160 Drug-induced hypoglycemia without coma: Secondary | ICD-10-CM

## 2017-08-06 DIAGNOSIS — E1122 Type 2 diabetes mellitus with diabetic chronic kidney disease: Secondary | ICD-10-CM | POA: Diagnosis not present

## 2017-08-06 DIAGNOSIS — E1165 Type 2 diabetes mellitus with hyperglycemia: Secondary | ICD-10-CM | POA: Diagnosis not present

## 2017-08-06 DIAGNOSIS — F0150 Vascular dementia without behavioral disturbance: Secondary | ICD-10-CM | POA: Diagnosis not present

## 2017-08-06 LAB — COMPREHENSIVE METABOLIC PANEL
ALBUMIN: 3 g/dL — AB (ref 3.5–5.0)
ALK PHOS: 147 U/L — AB (ref 38–126)
ALT: 52 U/L (ref 17–63)
ANION GAP: 10 (ref 5–15)
AST: 49 U/L — ABNORMAL HIGH (ref 15–41)
BUN: 21 mg/dL — ABNORMAL HIGH (ref 6–20)
CALCIUM: 9.7 mg/dL (ref 8.9–10.3)
CHLORIDE: 101 mmol/L (ref 101–111)
CO2: 29 mmol/L (ref 22–32)
Creatinine, Ser: 1 mg/dL (ref 0.61–1.24)
GFR calc non Af Amer: >60 mL/min (ref >60)
Glucose, Bld: 61 mg/dL — ABNORMAL LOW (ref 65–99)
POTASSIUM: 3.7 mmol/L (ref 3.5–5.1)
SODIUM: 140 mmol/L (ref 135–145)
Total Bilirubin: 0.7 mg/dL (ref 0.3–1.2)
Total Protein: 6.7 g/dL (ref 6.5–8.1)

## 2017-08-06 LAB — SEDIMENTATION RATE: Sed Rate: 78 mm/hr — ABNORMAL HIGH (ref 0–16)

## 2017-08-06 LAB — VITAMIN B12: Vitamin B-12: 600 pg/mL (ref 180–914)

## 2017-08-06 LAB — GLUCOSE, CAPILLARY
GLUCOSE-CAPILLARY: 111 mg/dL — AB (ref 65–99)
GLUCOSE-CAPILLARY: 147 mg/dL — AB (ref 65–99)
GLUCOSE-CAPILLARY: 228 mg/dL — AB (ref 65–99)
GLUCOSE-CAPILLARY: 36 mg/dL — AB (ref 65–99)
Glucose-Capillary: 116 mg/dL — ABNORMAL HIGH (ref 65–99)
Glucose-Capillary: 130 mg/dL — ABNORMAL HIGH (ref 65–99)
Glucose-Capillary: 146 mg/dL — ABNORMAL HIGH (ref 65–99)
Glucose-Capillary: 222 mg/dL — ABNORMAL HIGH (ref 65–99)
Glucose-Capillary: 409 mg/dL — ABNORMAL HIGH (ref 65–99)

## 2017-08-06 LAB — CBC
HEMATOCRIT: 28.4 % — AB (ref 39.0–52.0)
HEMOGLOBIN: 9.1 g/dL — AB (ref 13.0–17.0)
MCH: 27.1 pg (ref 26.0–34.0)
MCHC: 32 g/dL (ref 30.0–36.0)
MCV: 84.5 fL (ref 78.0–100.0)
Platelets: 315 10*3/uL (ref 150–400)
RBC: 3.36 MIL/uL — AB (ref 4.22–5.81)
RDW: 14 % (ref 11.5–15.5)
WBC: 6.3 10*3/uL (ref 4.0–10.5)

## 2017-08-06 LAB — TSH: TSH: 2.315 u[IU]/mL (ref 0.350–4.500)

## 2017-08-06 LAB — RPR: RPR: NONREACTIVE

## 2017-08-06 LAB — AMMONIA: AMMONIA: 17 umol/L (ref 9–35)

## 2017-08-06 MED ORDER — WHITE PETROLATUM EX OINT
TOPICAL_OINTMENT | CUTANEOUS | Status: AC
  Administered 2017-08-06: 16:00:00
  Filled 2017-08-06: qty 28.35

## 2017-08-06 MED ORDER — DEXTROSE 50 % IV SOLN
1.0000 | Freq: Once | INTRAVENOUS | Status: AC
  Administered 2017-08-06: 50 mL via INTRAVENOUS

## 2017-08-06 MED ORDER — INSULIN ASPART 100 UNIT/ML ~~LOC~~ SOLN
10.0000 [IU] | Freq: Once | SUBCUTANEOUS | Status: AC
  Administered 2017-08-06: 10 [IU] via SUBCUTANEOUS

## 2017-08-06 MED ORDER — DEXTROSE-NACL 5-0.45 % IV SOLN
INTRAVENOUS | Status: DC
  Administered 2017-08-06: 11:00:00 via INTRAVENOUS

## 2017-08-06 MED ORDER — COLLAGENASE 250 UNIT/GM EX OINT
TOPICAL_OINTMENT | Freq: Every day | CUTANEOUS | Status: DC
  Administered 2017-08-07 – 2017-08-08 (×2): via TOPICAL
  Filled 2017-08-06 (×2): qty 30

## 2017-08-06 MED ORDER — DEXTROSE 50 % IV SOLN
INTRAVENOUS | Status: AC
  Administered 2017-08-06: 50 mL via INTRAVENOUS
  Filled 2017-08-06: qty 50

## 2017-08-06 NOTE — Progress Notes (Signed)
SLP Cancellation Note  Patient Details Name: Carlos Schultz MRN: [id number] DOB: 1940-12-06   Cancelled treatment:       Reason Eval/Treat Not Completed: Patient at procedure or test/unavailable. In MRI. Will f/u.   Gabriel Rainwater MA, CCC-SLP [phone]    Gabriel Rainwater Meryl 08/06/2017, 11:46 AM

## 2017-08-06 NOTE — Progress Notes (Signed)
PT Cancellation Note  Patient Details Name: Larue Lightner MRN: [id number] DOB: 04/27/1941   Cancelled Treatment:    Reason Eval/Treat Not Completed: Patient at procedure or test/unavailable.  Pt is in MRI.  PT to check back later as time allows.   Thanks,    Barbarann Ehlers. [place], [place], DPT [phone]   08/06/2017, 10:54 AM

## 2017-08-06 NOTE — Progress Notes (Signed)
Patient ID: Carlos Schultz, male   DOB: 1941-02-12, 77 y.o.   MRN: [id number]  PROGRESS NOTE    Jiovanny Burdell  IFO:[id number] DOB: 10-12-1940 DOA: 08/05/2017 PCP: Seward Carol, MD   Brief Narrative:  77 year old male with history of hypertension, diabetes mellitus type II, chronic kidney disease unknown stage, CAD, dementia presented with altered mental status and elevated blood sugar.  Initial CT of the brain was negative for any acute abnormality.   Assessment & Plan:   Principal Problem:   Altered mental status Active Problems:   Type II diabetes mellitus with renal manifestations (HCC)   Anemia   Hyperglycemia  Probable acute metabolic encephalopathy causing altered mental status -Probably secondary to hyperglycemia.  Rule out organic brain pathology.  Initial CT head was negative.  Patient still confused.  Will get MRI of the brain -Patient's baseline mental status is unknown.  No family present at bedside -Monitor mental status.  Fall precautions -Ammonia, B12, TSH levels are normal.  Follow folate level  Diabetes mellitus type 2 with hyperglycemia and hypoglycemia -Patient presented with hyperglycemia and subsequently has been having hypoglycemic episodes.  Continue IV fluids to D5 half normal saline at 50 cc an hour. -Currently n.p.o.  Will get SLP evaluation -Hold oral meds. -Hemoglobin A1c in a.m. -Continue Accu-Cheks with coverage  Hyponatremia -Probably secondary to hyperglycemia.  Improved.  Monitor labs  Chronic kidney disease probably stage II - creatinine stable.  Repeat a.m. Labs  Anemia -Questionable cause.  No signs of bleeding.  Monitor  History of prostate cancer -Continue Xtandi  Dementia -Continue memantine -Patient looks very deconditioned and confused.  We will get palliative care consult for goals of care  History of hypertension and coronary artery disease - Continue aspirin, losartan and Lasix once able to take orally  DVT  prophylaxis: Lovenox Code Status: Full Family Communication: None at bedside Disposition Plan: Probably will need nursing home  Consultants: None  Procedures: None  Antimicrobials: None   Subjective: Patient seen and examined at bedside.  He is sleepy but wakes up on calling his name.  He is confused.  No overnight fever or vomiting.  Objective: Vitals:   08/05/17 2300 08/06/17 0030 08/06/17 0400 08/06/17 0800  BP:  138/65 139/69 127/61  Pulse:  83 85 86  Resp:  18 18 18   Temp:  98.5 F (36.9 C) 98.7 F (37.1 C) 99.1 F (37.3 C)  TempSrc:  Axillary Axillary Oral  SpO2:  98% 98% 95%  Weight: 67.5 kg (148 lb 13 oz) 64.7 kg (142 lb 10.2 oz) 65 kg (143 lb 4.8 oz)   Height: 5\' 9"  (1.753 m) 5\' 9"  (1.753 m)      Intake/Output Summary (Last 24 hours) at 08/06/2017 1003 Last data filed at 08/06/2017 0901 Gross per 24 hour  Intake 833.34 ml  Output 1000 ml  Net -166.66 ml   Filed Weights   08/05/17 2300 08/06/17 0030 08/06/17 0400  Weight: 67.5 kg (148 lb 13 oz) 64.7 kg (142 lb 10.2 oz) 65 kg (143 lb 4.8 oz)    Examination:  General exam: Elderly male lying in bed, confused Respiratory system: Bilateral decreased breath sound at bases Cardiovascular system: S1 & S2 heard, rate controlled  gastrointestinal system: Abdomen is nondistended, soft and nontender. Normal bowel sounds heard. Central nervous system:, Wakes up on calling his name but confused. No focal neurological deficits. Moving extremities Extremities: No cyanosis, clubbing, edema  Skin: No rashes, lesions or ulcers Lymph: No cervical lymphadenopathy  Data Reviewed: I have personally reviewed following labs and imaging studies  CBC: Recent Labs  Lab 08/05/17 1624 08/06/17 0207  WBC 6.9 6.3  NEUTROABS 5.2  --   HGB 9.4* 9.1*  HCT 30.0* 28.4*  MCV 84.7 84.5  PLT 312 235   Basic Metabolic Panel: Recent Labs  Lab 08/05/17 1624 08/06/17 0207  NA 129* 140  K 4.4 3.7  CL 93* 101  CO2 25 29    GLUCOSE 637* 61*  BUN 25* 21*  CREATININE 1.26* 1.00  CALCIUM 9.5 9.7   GFR: Estimated Creatinine Clearance: 56.9 mL/min (by C-G formula based on SCr of 1 mg/dL). Liver Function Tests: Recent Labs  Lab 08/06/17 0207  AST 49*  ALT 52  ALKPHOS 147*  BILITOT 0.7  PROT 6.7  ALBUMIN 3.0*   No results for input(s): LIPASE, AMYLASE in the last 168 hours. Recent Labs  Lab 08/06/17 0207  AMMONIA 17   Coagulation Profile: No results for input(s): INR, PROTIME in the last 168 hours. Cardiac Enzymes: Recent Labs  Lab 08/05/17 1955  TROPONINI <0.03   BNP (last 3 results) No results for input(s): PROBNP in the last 8760 hours. HbA1C: No results for input(s): HGBA1C in the last 72 hours. CBG: Recent Labs  Lab 08/06/17 0038 08/06/17 0419 08/06/17 0453 08/06/17 0617 08/06/17 0732  GLUCAP 147* 36* 146* 130* 116*   Lipid Profile: No results for input(s): CHOL, HDL, LDLCALC, TRIG, CHOLHDL, LDLDIRECT in the last 72 hours. Thyroid Function Tests: Recent Labs    08/06/17 0207  TSH 2.315   Anemia Panel: Recent Labs    08/06/17 0207  VITAMINB12 600   Sepsis Labs: No results for input(s): PROCALCITON, LATICACIDVEN in the last 168 hours.  No results found for this or any previous visit (from the past 240 hour(s)).       Radiology Studies: Dg Shoulder Right  Result Date: 08/05/2017 CLINICAL DATA:  Bruising about the anterior aspect of the right shoulder. The patient may have suffered a fall. Initial encounter. EXAM: RIGHT SHOULDER - 2+ VIEW COMPARISON:  None. FINDINGS: No acute bony or joint abnormality is seen. The patient has advanced acromioclavicular osteoarthritis. High-riding humeral head compatible with chronic rotator cuff tear is noted. Image right lung and ribs appear normal. IMPRESSION: No acute finding. Advanced acromioclavicular osteoarthritis. Findings compatible with chronic rotator cuff tear. Electronically Signed   By: Inge Rise M.D.   On:  08/05/2017 17:16   Ct Head Wo Contrast  Result Date: 08/05/2017 CLINICAL DATA:  Altered mental status in a patient with a history of Alzheimer's. The patient was found on the floor today. EXAM: CT HEAD WITHOUT CONTRAST TECHNIQUE: Contiguous axial images were obtained from the base of the skull through the vertex without intravenous contrast. COMPARISON:  Head CT scan 04/24/2017. FINDINGS: Brain: No evidence of acute infarction, hemorrhage, hydrocephalus, extra-axial collection or mass lesion/mass effect. There is some cortical atrophy and chronic microvascular ischemic change. Vascular: Atherosclerosis noted. Skull: Intact. Sinuses/Orbits: Status post bilateral lens extraction. Other: None. IMPRESSION: No acute abnormality. Atrophy and chronic microvascular ischemic change. Atherosclerosis. Electronically Signed   By: Inge Rise M.D.   On: 08/05/2017 21:24        Scheduled Meds: . amLODipine  5 mg Oral Daily  . aspirin EC  81 mg Oral BID  . benazepril  40 mg Oral Daily  . enoxaparin (LOVENOX) injection  40 mg Subcutaneous Q24H  . enzalutamide  160 mg Oral Daily  . furosemide  40 mg Oral  Daily  . insulin aspart  0-9 Units Subcutaneous Q4H  . memantine  5 mg Oral BID  . naphazoline-glycerin  1 drop Both Eyes BID  . trimethoprim-polymyxin b  1 drop Both Eyes Q4H   Continuous Infusions: . dextrose 5 % and 0.45% NaCl       LOS: 1 day        Aline August, MD [facility] Pager [phone]  If 7PM-7AM, please contact night-coverage [email or web address] Password [facility] 08/06/2017, 10:03 AM

## 2017-08-06 NOTE — Progress Notes (Signed)
CSW assisting with transfer to [facility] when stable for DC- per MD anticipate 1-2 days.  [place] aware of patient and able to admit for LTC when ready  Jorge Ny, [place] Social Worker [phone]

## 2017-08-06 NOTE — Progress Notes (Signed)
RN spoke to attending MD this AM regarding patient's low glucose level and NPO order at this time. Orders received. Will continue to monitor.     Ave Filter, RN

## 2017-08-06 NOTE — NC FL2 (Signed)
[place] LEVEL OF CARE SCREENING TOOL     IDENTIFICATION  Patient Name: Carlos Schultz Birthdate: 12-03-1940 Sex: male Admission Date (Current Location): 08/05/2017  [facility] and [place] Number:  Herbalist and Address:  [facility], [place] [address] [place], [place] [address]      Provider Number: [id number]  Attending Physician Name and Address:  Aline August, MD  Relative Name and Phone Number:       Current Level of Care: Hospital Recommended Level of Care: [place] Prior Approval Number:    Date Approved/Denied:   PASRR Number: [id number]  Discharge Plan: SNF    Current Diagnoses: Patient Active Problem List   Diagnosis Date Noted  . Altered mental status 08/05/2017  . Hyperglycemia 08/05/2017  . Closed right hip fracture, initial encounter ([place]) 04/24/2017  . Hip fracture ([place]) 04/24/2017  . AKI (acute kidney injury) ([place])   . Essential hypertension 04/13/2017  . Elevated liver function tests 04/13/2017  . Acute metabolic encephalopathy 56/21/3086  . CKD (chronic kidney disease), stage III ([place]) 04/13/2017  . Prostate cancer ([place]) 10/10/2016  . Closed fracture of phalanx or phalanges of hand 05/25/2014  . UTI (urinary tract infection) 05/25/2014  . Cognitive disorder   . Hypoglycemia 05/23/2014  . Pedal edema 05/23/2014  . URINARY INCONTINENCE, URGE 01/03/2009  . FATIGUE 02/22/2008  . Memory loss 12/22/2007  . RENAL INSUFFICIENCY, CHRONIC 09/22/2007  . Type II diabetes mellitus with renal manifestations ([place]) 09/10/2007  . Anemia 09/10/2007  . SYNCOPE 09/10/2007  . HYPERLIPIDEMIA 04/02/2007  . HEMATURIA, GROSS 04/02/2007  . ADENOCARCINOMA, PROSTATE, HX OF 06/11/1995    Orientation RESPIRATION BLADDER Height & Weight     Self  Normal Incontinent Weight: 143 lb 4.8 oz (65 kg) Height:  5\' 9"  (175.3 cm)  BEHAVIORAL SYMPTOMS/MOOD NEUROLOGICAL BOWEL NUTRITION STATUS      Incontinent  Diet(see DC summary)  AMBULATORY STATUS COMMUNICATION OF NEEDS Skin   Total Care Verbally PU Stage and Appropriate Care, Other (Comment)(unstabeable on right shoulder)   PU Stage 2 Dressing: (on left knee)                   Personal Care Assistance Level of Assistance  Bathing, Dressing Bathing Assistance: Maximum assistance   Dressing Assistance: Maximum assistance     Functional Limitations Info             SPECIAL CARE FACTORS FREQUENCY                       Contractures      Additional Factors Info  Code Status, Allergies, Insulin Sliding Scale Code Status Info: FULL Allergies Info: NKA   Insulin Sliding Scale Info: 6/day       Current Medications (08/06/2017):  This is the current hospital active medication list Current Facility-Administered Medications  Medication Dose Route Frequency Provider Last Rate Last Dose  . acetaminophen (TYLENOL) tablet 650 mg  650 mg Oral Q6H PRN Jani Gravel, MD       Or  . acetaminophen (TYLENOL) suppository 650 mg  650 mg Rectal Q6H PRN Jani Gravel, MD      . amLODipine (NORVASC) tablet 5 mg  5 mg Oral Daily Jani Gravel, MD      . aspirin EC tablet 81 mg  81 mg Oral BID Jani Gravel, MD      . benazepril (LOTENSIN) tablet 40 mg  40 mg Oral Daily Jani Gravel, MD      .  collagenase (SANTYL) ointment   Topical Daily Alekh, Kshitiz, MD      . dextrose 5 %-0.45 % sodium chloride infusion   Intravenous Continuous Starla Link, Kshitiz, MD 50 mL/hr at 08/06/17 1038    . docusate sodium (COLACE) capsule 100 mg  100 mg Oral BID PRN Jani Gravel, MD      . enoxaparin (LOVENOX) injection 40 mg  40 mg Subcutaneous Q24H Jani Gravel, MD      . enzalutamide Gillermina Phy) capsule 160 mg  160 mg Oral Daily Jani Gravel, MD      . furosemide (LASIX) tablet 40 mg  40 mg Oral Daily Jani Gravel, MD      . insulin aspart (novoLOG) injection 0-9 Units  0-9 Units Subcutaneous Q4H Jani Gravel, MD   1 Units at 08/06/17 0050  . memantine (NAMENDA) tablet 5 mg  5 mg Oral  BID Jani Gravel, MD      . naphazoline-glycerin (CLEAR EYES REDNESS) ophth solution 1 drop  1 drop Both Eyes BID Jani Gravel, MD      . trimethoprim-polymyxin b (POLYTRIM) ophthalmic solution 1 drop  1 drop Both Eyes Q4H Sherwood Gambler, MD      . white petrolatum (VASELINE) gel              Discharge Medications: Please see discharge summary for a list of discharge medications.  Relevant Imaging Results:  Relevant Lab Results:   Additional Information SS#: [id number]  Jorge Ny, LCSW

## 2017-08-06 NOTE — Consult Note (Addendum)
[place] Nurse wound consult note Reason for Consult: pressure injury Patient with Alzheimer's, not able to converse on most topics.  His daughter is at the bedside and reports he has been living at home with his son, however the care at home as not been adequate to keep the patient safe at home. He has experienced several falls and has been "found down" at home several times. His most recent time down was on the right shoulder and side which is consistent with the current pressure injuries he has   He has an area on the left knee that has completely re-epithelialized, it is closed intact skin.  Pink in color but healed.   Wound type: Unstageable pressure injury: right shoulder Unstageable pressure injuries x 2 RLE. Pressure Injury POA: Yes Measurement: Right shoulder: 1.5cm x 3.0cm x 0cm; 100% eschar Right lateral leg 1.5cm x 1.0cm x 0cm; 100% eschar Right posterior leg 2.)cm x 1.0cm x 0cm; 100% eschar Wound bed: see above Drainage (amount, consistency, odor) none Dressing procedure/placement/frequency: Add enzymatic debridement ointment to the injuries to clear of non viable tissue. Cover with saline moist gauze, cover with dry dressing. Change daily.   Discussed POC with patient and bedside nurse.  Re consult if needed, will not follow at this time. Thanks  Dilraj Killgore [employer], RN,CWOCN, CNS, [place] [phone])

## 2017-08-06 NOTE — Progress Notes (Signed)
Palliative:  Consult for [place] received. I will meet with daughter/guardian at patient's bedside tomorrow 08/07/17 at 1300 pm. Thank you for this consult.   No charge  Vinie Sill, NP Palliative Medicine Team Pager # [phone] (M-F 8a-5p) Team Phone # [phone] (Nights/Weekends)

## 2017-08-06 NOTE — ED Notes (Signed)
This RN made contact with Carlos Schultz and he was informed pt is going to 3W room 15 and will be made XXX. [name], Rn informed.

## 2017-08-06 NOTE — Progress Notes (Signed)
MD ok to stop IVF this time since pt has a diet and able to tolerate diet.  Ave Filter, RN

## 2017-08-06 NOTE — Evaluation (Signed)
Physical Therapy Evaluation Patient Details Name: Carlos Schultz MRN: [id number] DOB: [birth year] Today's Date: 08/06/2017   History of Present Illness  77 y.o. male admitted on 08/05/17 for AMS.  Dx with probably acute metabolic encepholopathy, uncontrolled DM, hyponatremia, and anemia.  Pt with other significant PMH of MI, HTN, DM, gout, dementia (Alzheimer's mentioned in chart), R hip fx s/p IM nail (04/2017), CKD, and CA (prostate).  Clinical Impression  Pt is generally weak and deconditioned.  He does well seated, but with attempts at standing he is significant amount of assist.  Per his daughter, he was not ambulatory PTA, but was able to help stand and pivot to his WC.  He will need SNF placement with transition to long term care after as he is not functioning well at home and this is what his daughter is trying to arrange already when he was admitted.  PT to follow acutely for deficits listed below.       Follow Up Recommendations SNF;Supervision/Assistance - 24 hour    Equipment Recommendations  None recommended by PT    Recommendations for Other Services   NA    Precautions / Restrictions Precautions Precautions: Fall Precaution Comments: h/o multiple falls R sided wounds (shoulder, knee)       Mobility  Bed Mobility Overal bed mobility: Needs Assistance Bed Mobility: Supine to Sit     Supine to sit: Mod assist;HOB elevated     General bed mobility comments: Mod assist to progress bil legs over EOB and support trunk to get to sitting.  He needed manual facilitation and basic cues.  Transfers Overall transfer level: Needs assistance Equipment used: None Transfers: Sit to/from Stand Sit to Stand: Mod assist;From elevated surface         General transfer comment: Mod assist to support trunk and transition to stand.  Pt is only able to come ~3/4 of the way up and groaning in discomfort during the transition.  Attempted to stand again and he only got ~1/2 way up and  was resisting.    Ambulation/Gait             General Gait Details: per daughter, he was transfers to [facility] only PTA.          Balance Overall balance assessment: Needs assistance Sitting-balance support: Feet supported;No upper extremity supported Sitting balance-Leahy Scale: Good Sitting balance - Comments: EOB supervision.  He sat for >15 mins (and longer as SLP saw him EOB after me).     Standing balance support: Bilateral upper extremity supported Standing balance-Leahy Scale: Poor Standing balance comment: one person mod assist for partial stand.                               Pertinent Vitals/Pain Pain Assessment: Faces Faces Pain Scale: Hurts even more Pain Location: generalized joint/back with attempts at standing.  Pain Descriptors / Indicators: Grimacing;Guarding Pain Intervention(s): Limited activity within patient's tolerance;Monitored during session;Repositioned    Home Living Family/patient expects to be discharged to:: Skilled nursing facility                 Additional Comments: Per chart, pt lived at home alone prior to previous admission earlier this month. But daughter is actively looking for more permenant residence for him where he could get more asistance    Prior Function Level of Independence: Needs assistance   Gait / Transfers Assistance Needed: per daughter, his son was picking him  up and putting him in a WC, he did not walk.            Hand Dominance   Dominant Hand: Right    Extremity/Trunk Assessment   Upper Extremity Assessment Upper Extremity Assessment: Defer to OT evaluation    Lower Extremity Assessment Lower Extremity Assessment: Generalized weakness(3-/5 grossly, cannot follow commands for MMT)    Cervical / Trunk Assessment Cervical / Trunk Assessment: Other exceptions Cervical / Trunk Exceptions: pt reporting back pain in standing.  Daughter reports this is chronic and he had difficulty getting fully  upright.   Communication      Cognition Arousal/Alertness: Awake/alert Behavior During Therapy: Restless Overall Cognitive Status: History of cognitive impairments - at baseline Area of Impairment: Orientation;Attention;Memory;Following commands;Safety/judgement;Problem solving;Awareness                 Orientation Level: Disoriented to;Person;Place;Time;Situation(can name his daughter, but not her relation) Current Attention Level: Focused(mostly focused) Memory: Decreased short-term memory Following Commands: Follows one step commands inconsistently Safety/Judgement: Decreased awareness of safety;Decreased awareness of deficits Awareness: Intellectual Problem Solving: Decreased initiation;Difficulty sequencing;Requires verbal cues;Requires tactile cues General Comments: Pt needed multimodal cues and initiation by PT to get to sitting and standing.  Simple commands and automatic motions are easier.  He has h/o dementia, but this is worse than normal.  Per daughter, he is struggling at home even with help.   He is internally and externally distracted (lines, noises in the hallway).  I did not have him do a functional familiar task, I bet he does have some sustained attention to familiar tasks (eating, washing his face, maybe brushing teeth).               Assessment/Plan    PT Assessment Patient needs continued PT services  PT Problem List Decreased strength;Decreased range of motion;Decreased activity tolerance;Decreased balance;Decreased mobility;Decreased cognition;Decreased knowledge of use of DME;Pain       PT Treatment Interventions DME instruction;Functional mobility training;Therapeutic activities;Therapeutic exercise;Balance training;Cognitive remediation;Patient/family education    PT Goals (Current goals can be found in the Care Plan section)  Acute Rehab PT Goals Patient Stated Goal: family would like him better cared for  PT Goal Formulation: With family Time  For Goal Achievement: 08/20/17 Potential to Achieve Goals: Good    Frequency Min 2X/week   Barriers to discharge Decreased caregiver support poor social support.         AM-PAC PT "6 Clicks" Daily Activity  Outcome Measure Difficulty turning over in bed (including adjusting bedclothes, sheets and blankets)?: Unable Difficulty moving from lying on back to sitting on the side of the bed? : Unable Difficulty sitting down on and standing up from a chair with arms (e.g., wheelchair, bedside commode, etc,.)?: Unable Help needed moving to and from a bed to chair (including a wheelchair)?: A Lot Help needed walking in hospital room?: Total Help needed climbing 3-5 steps with a railing? : Total 6 Click Score: 7    End of Session Equipment Utilized During Treatment: Gait belt Activity Tolerance: Patient tolerated treatment well Patient left: in bed;Other (comment)(seated EOB with SLP) Nurse Communication: Mobility status PT Visit Diagnosis: Repeated falls (R29.6);Muscle weakness (generalized) (M62.81);Difficulty in walking, not elsewhere classified (R26.2)    Time: 4270-6237 PT Time Calculation (min) (ACUTE ONLY): 30 min   Charges:         Wells Guiles B. My [place], PT, DPT [phone]   PT Evaluation $PT Eval Moderate Complexity: 1 Mod PT Treatments $Therapeutic Activity: 8-22 mins   08/06/2017,  2:31 PM

## 2017-08-06 NOTE — Progress Notes (Signed)
Hypoglycemic Event  CBG: 36  Treatment: D50 IV 50 mL  Symptoms: Pale  Follow-up CBG: [id number] CBG Result:146  Possible Reasons for Event: Inadequate meal intake and Medication regimen: insulin  Comments/MD notified:Continue to monitor.      Rohan Juenger, [place] Croon

## 2017-08-06 NOTE — Progress Notes (Signed)
Initial Nutrition Assessment  DOCUMENTATION CODES:   Not applicable  INTERVENTION:  Monitor for needs, diet advancement  NUTRITION DIAGNOSIS:   Inadequate oral intake related to inability to eat as evidenced by NPO status.  GOAL:   Patient will meet greater than or equal to 90% of their needs  MONITOR:   Diet advancement, I & O's, Labs, Weight trends, Skin  REASON FOR ASSESSMENT:   Malnutrition Screening Tool    ASSESSMENT:   Carlos Schultz is a 77 yo male PMH HTN, DM2, CAD, Dementia, presents with AMS, hyperglycemia.  Attempted to speak with patient at bedside but currently getting MRI of brain. Initial head CT was negative. Unknown baseline mental status. Deconditioned per MD note. RD will follow-up.  Labs reviewed Medications reviewed and include:  Insulin D5 1/2 NS at 58mL/hr --> 204 calories    NUTRITION - FOCUSED PHYSICAL EXAM:    Most Recent Value  Orbital Region  Unable to assess  Upper Arm Region  Unable to assess  Thoracic and Lumbar Region  Unable to assess  Buccal Region  Unable to assess  Temple Region  Unable to assess  Clavicle Bone Region  Unable to assess  Clavicle and Acromion Bone Region  Unable to assess  Scapular Bone Region  Unable to assess  Dorsal Hand  Unable to assess  Patellar Region  Unable to assess  Anterior Thigh Region  Unable to assess  Posterior Calf Region  Unable to assess  Edema (RD Assessment)  Unable to assess  Hair  Unable to assess  Eyes  Unable to assess  Mouth  Unable to assess  Skin  Unable to assess  Nails  Unable to assess       Diet Order:  Diet NPO time specified Except for: Sips with Meds Fall precautions  EDUCATION NEEDS:   Not appropriate for education at this time  Skin:  Skin Assessment: Skin Integrity Issues: Skin Integrity Issues:: Stage II, Unstageable Stage II: Knee Unstageable: Shoulder  Last BM:  08/06/2017  Height:   Ht Readings from Last 1 Encounters:  08/06/17 5\' 9"  (1.753 m)     Weight:   Wt Readings from Last 1 Encounters:  08/06/17 143 lb 4.8 oz (65 kg)    Ideal Body Weight:  72.72 kg  BMI:  Body mass index is 21.16 kg/m.  Estimated Nutritional Needs:   Kcal:  1625-1950 calories  Protein:  98-111 grams (1.5-1.7g/kg)  Fluid:  1.6-1.9L  Carlos Schultz. Carlos Vantine, MS, RD LDN Inpatient Clinical Dietitian Pager [phone]

## 2017-08-07 DIAGNOSIS — F0150 Vascular dementia without behavioral disturbance: Secondary | ICD-10-CM

## 2017-08-07 DIAGNOSIS — R4182 Altered mental status, unspecified: Secondary | ICD-10-CM | POA: Diagnosis not present

## 2017-08-07 DIAGNOSIS — Z7189 Other specified counseling: Secondary | ICD-10-CM | POA: Diagnosis not present

## 2017-08-07 DIAGNOSIS — R739 Hyperglycemia, unspecified: Secondary | ICD-10-CM | POA: Diagnosis not present

## 2017-08-07 DIAGNOSIS — E1122 Type 2 diabetes mellitus with diabetic chronic kidney disease: Secondary | ICD-10-CM | POA: Diagnosis not present

## 2017-08-07 DIAGNOSIS — F0390 Unspecified dementia without behavioral disturbance: Secondary | ICD-10-CM | POA: Diagnosis not present

## 2017-08-07 DIAGNOSIS — D649 Anemia, unspecified: Secondary | ICD-10-CM | POA: Diagnosis not present

## 2017-08-07 DIAGNOSIS — Z515 Encounter for palliative care: Secondary | ICD-10-CM | POA: Diagnosis not present

## 2017-08-07 DIAGNOSIS — E1165 Type 2 diabetes mellitus with hyperglycemia: Secondary | ICD-10-CM | POA: Diagnosis not present

## 2017-08-07 LAB — COMPREHENSIVE METABOLIC PANEL
ALK PHOS: 129 U/L — AB (ref 38–126)
ALT: 35 U/L (ref 17–63)
ANION GAP: 11 (ref 5–15)
AST: 36 U/L (ref 15–41)
Albumin: 2.7 g/dL — ABNORMAL LOW (ref 3.5–5.0)
BUN: 14 mg/dL (ref 6–20)
CALCIUM: 9.3 mg/dL (ref 8.9–10.3)
CO2: 26 mmol/L (ref 22–32)
CREATININE: 0.92 mg/dL (ref 0.61–1.24)
Chloride: 101 mmol/L (ref 101–111)
GFR calc non Af Amer: >60 mL/min (ref >60)
GLUCOSE: 86 mg/dL (ref 65–99)
Potassium: 3.7 mmol/L (ref 3.5–5.1)
Sodium: 138 mmol/L (ref 135–145)
TOTAL PROTEIN: 6.6 g/dL (ref 6.5–8.1)
Total Bilirubin: 0.7 mg/dL (ref 0.3–1.2)

## 2017-08-07 LAB — CBC WITH DIFFERENTIAL/PLATELET
Basophils Absolute: 0 10*3/uL (ref 0.0–0.1)
Basophils Relative: 0 %
Eosinophils Absolute: 0 10*3/uL (ref 0.0–0.7)
Eosinophils Relative: 1 %
HEMATOCRIT: 28.2 % — AB (ref 39.0–52.0)
HEMOGLOBIN: 8.9 g/dL — AB (ref 13.0–17.0)
LYMPHS ABS: 1.7 10*3/uL (ref 0.7–4.0)
Lymphocytes Relative: 26 %
MCH: 26.4 pg (ref 26.0–34.0)
MCHC: 31.6 g/dL (ref 30.0–36.0)
MCV: 83.7 fL (ref 78.0–100.0)
MONOS PCT: 7 %
Monocytes Absolute: 0.4 10*3/uL (ref 0.1–1.0)
NEUTROS ABS: 4.2 10*3/uL (ref 1.7–7.7)
NEUTROS PCT: 66 %
Platelets: 336 10*3/uL (ref 150–400)
RBC: 3.37 MIL/uL — ABNORMAL LOW (ref 4.22–5.81)
RDW: 14.2 % (ref 11.5–15.5)
WBC: 6.3 10*3/uL (ref 4.0–10.5)

## 2017-08-07 LAB — GLUCOSE, CAPILLARY
GLUCOSE-CAPILLARY: 105 mg/dL — AB (ref 65–99)
GLUCOSE-CAPILLARY: 198 mg/dL — AB (ref 65–99)
Glucose-Capillary: 41 mg/dL — CL (ref 65–99)
Glucose-Capillary: 93 mg/dL (ref 65–99)
Glucose-Capillary: 226 mg/dL — ABNORMAL HIGH (ref 65–99)
Glucose-Capillary: 133 mg/dL — ABNORMAL HIGH (ref 65–99)

## 2017-08-07 LAB — MAGNESIUM: Magnesium: 1.4 mg/dL — ABNORMAL LOW (ref 1.7–2.4)

## 2017-08-07 LAB — HEMOGLOBIN A1C
HEMOGLOBIN A1C: 10.6 % — AB (ref 4.8–5.6)
MEAN PLASMA GLUCOSE: 257.52 mg/dL

## 2017-08-07 MED ORDER — DEXTROSE 50 % IV SOLN
25.0000 mL | Freq: Once | INTRAVENOUS | Status: AC
  Administered 2017-08-07: 25 mL via INTRAVENOUS

## 2017-08-07 MED ORDER — PRO-STAT SUGAR FREE PO LIQD
30.0000 mL | Freq: Two times a day (BID) | ORAL | Status: DC
  Administered 2017-08-07 – 2017-08-08 (×2): 30 mL via ORAL
  Filled 2017-08-07 (×2): qty 30

## 2017-08-07 MED ORDER — MAGNESIUM SULFATE 2 GM/50ML IV SOLN
2.0000 g | Freq: Once | INTRAVENOUS | Status: AC
  Administered 2017-08-07: 2 g via INTRAVENOUS
  Filled 2017-08-07: qty 50

## 2017-08-07 MED ORDER — DEXTROSE 50 % IV SOLN
INTRAVENOUS | Status: AC
  Filled 2017-08-07: qty 50

## 2017-08-07 NOTE — Progress Notes (Signed)
Hypoglycemic Event  CBG: 41  Treatment: 25 ml of 50% dextrose  Symptoms: asymptomatic  Follow-up CBG: Time:0421 CBG Result:105 Possible Reasons for Event: poor intake Comments/MD notified: texted triad on call no reply       Carlos Schultz

## 2017-08-07 NOTE — Progress Notes (Signed)
   08/06/17 1300  SLP Visit Information  SLP Received On 08/06/17  General Information  HPI 77 year old male with history of hypertension, diabetes mellitus type II,chronic kidney disease unknown stage, CAD, dementia presented with altered mental status and elevated blood sugar. Initial CT of the brain was negative for any acute abnormality. MRI also negative.   Type of Study Bedside Swallow Evaluation  Diet Prior to this Study NPO  Temperature Spikes Noted No  Respiratory Status Room air  History of Recent Intubation No  Behavior/Cognition Alert;Cooperative;Requires cueing;Distractible  Oral Care Completed by SLP No  Oral Cavity - Dentition Poor condition;Missing dentition  Vision Functional for self-feeding  Self-Feeding Abilities Able to feed self;Needs assist  Patient Positioning Other (comment) (edge of bed)  Baseline Vocal Quality Wet  Volitional Cough Strong  Volitional Swallow Able to elicit  Oral Motor/Sensory Function  Overall Oral Motor/Sensory Function WFL  Thin Liquid  Thin Liquid WFL  Presentation Cup;Straw;Self Fed  Nectar Thick Liquid  Nectar Thick Liquid NT  Honey Thick Liquid  Honey Thick Liquid NT  Puree  Puree Impaired  Presentation Spoon  Pharyngeal Phase Impairments Wet Vocal Quality;Throat Clearing - Delayed  Solid  Solid Impaired  Pharyngeal Phase Impairments Wet Vocal Quality  SLP Assessment  Clinical Impression Statement (ACUTE ONLY) Pt demonstrates signs of mild dysphagia with suspected oropharyngeal residuals with solids. Pt takes sips of liquids without coughing or signs of aspiraiton. While consuming 4 oz of puree and cheese crackers there is intermittent wet vocal quality and occasional throat clear. Recommend pt initiate a dys 3 diet (missing dentition) and thin liquids with full supervision and aspiration precautions, particularly upright posture. Will follow for tolerance or need for further intervention.   SLP Visit Diagnosis Dysphagia,  oropharyngeal phase (R13.12)  Impact on safety and function Mild aspiration risk  Swallow Evaluation Recommendations  SLP Diet Recommendations Dysphagia 3 (Mech soft);Thin liquid  Liquid Administration via Cup;Straw  Medication Administration Crushed with puree  Supervision Staff to assist with self feeding  Compensations Slow rate;Small sips/bites  Postural Changes Seated upright at 90 degrees;Remain upright for at least 30 minutes after po intake  Treatment Plan  Treatment Recommendations Therapy as outlined in treatment plan below  Follow up Recommendations Skilled Nursing facility  Speech Therapy Frequency (ACUTE ONLY) min 2x/week  Treatment Duration 2 weeks  Prognosis  Prognosis for Safe Diet Advancement Good  Individuals Consulted  Consulted and Agree with Results and Recommendations Family member/caregiver  Family Member Consulted daughter  Progression Toward Goals  Progression toward goals Progressing toward goals  SLP Time Calculation  SLP Start Time (ACUTE ONLY) 1400  SLP Stop Time (ACUTE ONLY) 1415  SLP Time Calculation (min) (ACUTE ONLY) 15 min  SLP Evaluations  $ SLP Speech Visit 1 Visit  SLP Evaluations  $BSS Swallow 1 Procedure

## 2017-08-07 NOTE — Progress Notes (Signed)
Initial Nutrition Assessment  DOCUMENTATION CODES:   Non-severe (moderate) malnutrition in context of chronic illness  INTERVENTION:  1. Magic cup TID with meals, each supplement provides 290 kcal and 9 grams of protein  2. Pro-stat 81mL BID, each supplement provides 100 calories and 15 grams of protein  NUTRITION DIAGNOSIS:   Moderate Malnutrition related to chronic illness as evidenced by moderate fat depletion, moderate muscle depletion -ongoing  GOAL:   Patient will meet greater than or equal to 90% of their needs -progressing  MONITOR:   PO intake, I & O's, Labs, Weight trends, Supplement acceptance, Diet advancement  ASSESSMENT:   Carlos Schultz is a 77 yo male PMH HTN, DM2, CAD, Dementia, presents with AMS, hyperglycemia.  Spoke with patient's nurse. He is eating 100%, just has to be fed. Full assist. Had eggs with cheese, a biscuit, grits, sausage, orange juice, and milk for breakfast. Meal Completion: 100% Continues with AMS, did not respond to RD at all. Did make chewing motions and move arms.  Labs reviewed:  CBGs 198, 98, 105 Mg 1.4  Medications reviewed and include:  Insulin   NUTRITION - FOCUSED PHYSICAL EXAM:    Most Recent Value  Orbital Region  Moderate depletion  Upper Arm Region  Moderate depletion  Thoracic and Lumbar Region  Moderate depletion  Buccal Region  Moderate depletion  Temple Region  Moderate depletion  Clavicle Bone Region  Moderate depletion  Clavicle and Acromion Bone Region  Moderate depletion  Scapular Bone Region  Unable to assess  Dorsal Hand  Unable to assess  Patellar Region  Moderate depletion  Anterior Thigh Region  Moderate depletion  Posterior Calf Region  Moderate depletion  Edema (RD Assessment)  None  Hair  Reviewed  Eyes  Reviewed  Mouth  Reviewed  Skin  Reviewed  Nails  Reviewed       Diet Order:  Fall precautions DIET DYS 3 Room service appropriate? Yes; Fluid consistency: Thin  EDUCATION NEEDS:    Not appropriate for education at this time  Skin:  Skin Assessment: Skin Integrity Issues: Skin Integrity Issues:: Stage II, Unstageable Stage II: Knee Unstageable: Shoulder  Last BM:  08/06/2017  Height:   Ht Readings from Last 1 Encounters:  08/06/17 5\' 9"  (1.753 m)    Weight:   Wt Readings from Last 1 Encounters:  08/07/17 139 lb 12.4 oz (63.4 kg)    Ideal Body Weight:  72.72 kg  BMI:  Body mass index is 20.64 kg/m.  Estimated Nutritional Needs:   Kcal:  1625-1950 calories  Protein:  98-111 grams (1.5-1.7g/kg)  Fluid:  1.6-1.9L  Satira Anis. Dempsey Knotek, MS, RD LDN Inpatient Clinical Dietitian Pager [phone]

## 2017-08-07 NOTE — Consult Note (Signed)
Consultation Note Date: 08/07/2017   Patient Name: Carlos Schultz  DOB: 1941/02/22  MRN: [id number]  Age / Sex: 77 y.o., male  PCP: Seward Carol, MD Referring Physician: Aline August, MD  Reason for Consultation: Establishing goals of care  HPI/Patient Profile: 77 y.o. male  with past medical history of dementia, HTN, CAD, CKD, DM, prostate cancer admitted on 08/05/2017 with AMS and hyperglycemia and has had reportedly declining functional status since fall 07/24/17. CT head shows nothing acute but reflects dementia. He continues with episodes of hyper and hypoglycemia. CSW has arranged for long term placement once stable for discharge. Palliative care requested for Carlos Schultz.   Clinical Assessment and Goals of Care: I have met today with Carlos Schultz daughter and legal guardian and her husband at bedside. They explain their concerns with her father's safety at home and are thankful for him being in the hospital and look forward to transition to [facility] as this is near their home and they will be able to check on him daily. They were already checking on him and bathing him every other day while he was in her brother's care. Many concerns with his safety in past situation that has been relayed to CSW and APS case open.   I spoke further with them about his dementia. She tells me that she can tell that it is getting worse and that many times lately he does not recognize her but sometimes she will make him keep guessing until he knows who she is. I began to explain to them dementia signs/symptoms and trajectory. Daughter began to ask a lot of questions and I explained to her that dementia is a progressive disease and is a terminal disease. This took her by surprise and she had no idea of where this disease would lead them. "So he will die from this?!" I explained that at some point we would expect that a complication  from dementia could lead to his death. She was very upset from this new information so I provided emotional support.   Daughter was so overwhelmed and taken back from learning that dementia is a terminal illness that I did not push the conversation further at this time. I did provide her with a Hard Choices booklet which she gladly accepted. I also recommend palliative care to follow at [facility] upon discharge to continue this conversation.   Primary Decision Maker LEGAL GUARDIAN Daughter Carlos Schultz (active as of 07/31/17)    SUMMARY OF RECOMMENDATIONS   - Education on dementia trajectory provided  - Will need outpatient palliative follow up at [facility]:  Full code - did not address today   Symptom Management:   SLP following and dys 3, thin liquids recommended. Family reports good appetite and intake.   Hopefully can stabilize blood sugars - per primary.   No signs or complaints of discomfort.   Palliative Prophylaxis:   Aspiration, Bowel Regimen, Delirium Protocol, Frequent Pain Assessment, Oral Care and Turn Reposition  Additional Recommendations (Limitations, Scope,  Preferences):  Full Scope Treatment  Psycho-social/Spiritual:   Desire for further Chaplaincy support:yes  Additional Recommendations: Caregiving  Support/Resources and Grief/Bereavement Support  Prognosis:   Prognosis overall poor with advancing dementia.   Discharge Planning: [facility] for rehab with Palliative care service follow-up      Primary Diagnoses: Present on Admission: . Altered mental status . Hyperglycemia . Type II diabetes mellitus with renal manifestations ([place]) . Anemia   I have reviewed the medical record, interviewed the patient and family, and examined the patient. The following aspects are pertinent.  Past Medical History:  Diagnosis Date  . Cancer [facility]) prostate  . Chronic kidney disease   . Closed right hip fracture ([place])  04/2017  . Dementia   . Diabetes mellitus without complication ([place])    type II   . Gout   . Hypertension   . Myocardial infarction [facility])    Social History   Socioeconomic History  . Marital status: Widowed    Spouse name: None  . Number of children: None  . Years of education: None  . Highest education level: None  Social Needs  . Financial resource strain: None  . Food insecurity - worry: None  . Food insecurity - inability: None  . Transportation needs - medical: None  . Transportation needs - non-medical: None  Occupational History  . None  Tobacco Use  . Smoking status: Former Research scientist (life sciences)  . Smokeless tobacco: Never Used  . Tobacco comment: QUIT YEARS AGO   Substance and Sexual Activity  . Alcohol use: No  . Drug use: No  . Sexual activity: No  Other Topics Concern  . None  Social History Narrative  . None   Family History  Problem Relation Age of Onset  . Diabetes Mellitus II Sister   . Diabetes Mellitus II Brother    Scheduled Meds: . amLODipine  5 mg Oral Daily  . aspirin EC  81 mg Oral BID  . benazepril  40 mg Oral Daily  . collagenase   Topical Daily  . dextrose      . enoxaparin (LOVENOX) injection  40 mg Subcutaneous Q24H  . enzalutamide  160 mg Oral Daily  . furosemide  40 mg Oral Daily  . insulin aspart  0-9 Units Subcutaneous Q4H  . memantine  5 mg Oral BID  . naphazoline-glycerin  1 drop Both Eyes BID  . trimethoprim-polymyxin b  1 drop Both Eyes Q4H   Continuous Infusions: . magnesium sulfate 1 - 4 g bolus IVPB     PRN Meds:.acetaminophen **OR** acetaminophen, docusate sodium No Known Allergies Review of Systems  Unable to perform ROS: Dementia    Physical Exam  Constitutional: He appears well-developed. He appears lethargic.  HENT:  Head: Normocephalic and atraumatic.  Cardiovascular: Normal rate and regular rhythm.  Pulmonary/Chest: Effort normal. No accessory muscle usage. No tachypnea. No respiratory distress.  Abdominal: Soft.  Normal appearance.  Neurological: He appears lethargic. He is disoriented.  Nursing note and vitals reviewed.   Vital Signs: BP 136/71 (BP Location: Left Arm)   Pulse 89   Temp 98.4 F (36.9 C) (Oral)   Resp 18   Ht '5\' 9"'$  (1.753 m)   Wt 63.4 kg (139 lb 12.4 oz)   SpO2 100%   BMI 20.64 kg/m  Pain Assessment: 0-10 POSS *See Group Information*: 1-Acceptable,Awake and alert Pain Score: 0-No pain   SpO2: SpO2: 100 % O2 Device:SpO2: 100 % O2 Flow Rate: .   IO: Intake/output summary:  Intake/Output Summary (Last 24 hours) at 08/07/2017 1329 Last data filed at 08/07/2017 0700 Gross per 24 hour  Intake 998.33 ml  Output 1600 ml  Net -601.67 ml    LBM: Last BM Date: 08/06/17 Baseline Weight: Weight: 67.5 kg (148 lb 13 oz) Most recent weight: Weight: 63.4 kg (139 lb 12.4 oz)     Palliative Assessment/Data: 40%    Time Total: 35 min  Greater than 50%  of this time was spent counseling and coordinating care related to the above assessment and plan.  Signed by: Vinie Sill, NP Palliative Medicine Team Pager # [phone] (M-F 8a-5p) Team Phone # [phone] (Nights/Weekends)

## 2017-08-07 NOTE — Progress Notes (Signed)
  Speech Language Pathology Treatment: Dysphagia  Patient Details Name: Carlos Schultz MRN: [id number] DOB: May 18, 1941 Today's Date: 08/07/2017 Time: 3710-6269 SLP Time Calculation (min) (ACUTE ONLY): 23 min  Assessment / Plan / Recommendation Clinical Impression  Patient seen for f/u diet tolerance assessment. Patient alert, although eyes remained closed through out session. Orally accepting pos eagerly, consuming thin liquids via straw without overt indication of aspiration. Dysphagia 3 solids initially consumed without aspiration, progressed to mild wet vocal quality with spontaneous throat clear and clearance towards end of intake. Cannot r/o intermittent penetration/aspiration, particularly in light of impact of dementia on swallowing function, although at this time, current diet does appear appropriate. Temp stable and patient on room air. Family does report h/o presumed aspiration event at SNF in 05/2017 with subsequent change to thickened liquids, however unable to provide significant history. No record of instrumental testing noted in our system. Will continue to f/u.    HPI HPI: 77 year old male with history of hypertension, diabetes mellitus type II,chronic kidney disease unknown stage, CAD, dementia presented with altered mental status and elevated blood sugar. Initial CT of the brain was negative for any acute abnormality. MRI also negative.       SLP Plan  Continue with current plan of care       Recommendations  Diet recommendations: Dysphagia 3 (mechanical soft);Thin liquid Liquids provided via: Cup;Straw Medication Administration: Crushed with puree Supervision: Full supervision/cueing for compensatory strategies;Staff to assist with self feeding Compensations: Slow rate;Small sips/bites Postural Changes and/or Swallow Maneuvers: Seated upright 90 degrees                Oral Care Recommendations: Oral care BID Follow up Recommendations: Skilled Nursing  facility SLP Visit Diagnosis: Dysphagia, oropharyngeal phase (R13.12) Plan: Continue with current plan of care       [facility] [place], [place] [phone]               [name] Meryl 08/07/2017, 2:51 PM

## 2017-08-07 NOTE — Progress Notes (Signed)
Patient ID: Carlos Schultz, male   DOB: 1941/01/21, 77 y.o.   MRN: [id number]  PROGRESS NOTE    Carlos Schultz  WFU:[id number] DOB: 1940-10-19 DOA: 08/05/2017 PCP: Seward Carol, MD   Brief Narrative:  77 year old male with history of hypertension, diabetes mellitus type II, chronic kidney disease unknown stage, CAD, dementia presented with altered mental status and elevated blood sugar.  Initial CT of the brain was negative for any acute abnormality.   Assessment & Plan:   Principal Problem:   Altered mental status Active Problems:   Type II diabetes mellitus with renal manifestations (HCC)   Anemia   Hyperglycemia  Probable acute metabolic encephalopathy causing altered mental status -Probably secondary to hyperglycemia versus worsening dementia -MRI was negative for acute infarct. -Still confused. -Patient's baseline mental status is unknown.  No family present at bedside -Monitor mental status.  Fall precautions -Ammonia, B12, TSH levels are normal.  Diabetes mellitus type 2 with hyperglycemia and hypoglycemia -Patient presented with hyperglycemia and subsequently has been having hypoglycemic episodes intermittently.   -Continue Accu-Cheks. -Oral meds on hold. -Hemoglobin A1c in a.m is 10.6   Hyponatremia -Probably secondary to hyperglycemia.  Improved.  Monitor labs  Hypomagnesemia -Replace.  Repeat a.m. labs  Chronic kidney disease probably stage II - creatinine stable.  Repeat a.m. Labs  Anemia -Questionable cause.  No signs of bleeding.  Monitor  History of prostate cancer -Continue Xtandi  Dementia -Continue memantine -Patient looks very deconditioned and confused.  Palliative care evaluation for goals of care is pending  History of hypertension and coronary artery disease - Continue aspirin, losartan and Lasix once able to take orally  DVT prophylaxis: Lovenox Code Status: Full Family Communication: None at bedside Disposition Plan: Nursing home  probably tomorrow if remains stable Consultants: None  Procedures: None  Antimicrobials: None   Subjective: Patient seen and examined at bedside.  He is awake but confused.  No overnight fever or vomiting.  Objective: Vitals:   08/07/17 0357 08/07/17 0545 08/07/17 0808 08/07/17 1119  BP: 133/66  125/62 136/71  Pulse: 85  89 89  Resp: 18  18 18   Temp: 98.6 F (37 C)  98.4 F (36.9 C) 98.4 F (36.9 C)  TempSrc: Axillary  Oral Oral  SpO2: 98%  98% 100%  Weight: 63.3 kg (139 lb 8.8 oz) 63.4 kg (139 lb 12.4 oz)    Height:        Intake/Output Summary (Last 24 hours) at 08/07/2017 1316 Last data filed at 08/07/2017 0700 Gross per 24 hour  Intake 998.33 ml  Output 1600 ml  Net -601.67 ml   Filed Weights   08/06/17 0400 08/07/17 0357 08/07/17 0545  Weight: 65 kg (143 lb 4.8 oz) 63.3 kg (139 lb 8.8 oz) 63.4 kg (139 lb 12.4 oz)    Examination:  General exam: Elderly male lying in bed, confused.  No acute distress Respiratory system: Bilateral decreased breath sounds at bases Cardiovascular system: S1 & S2 heard, rate controlled  gastrointestinal system: Abdomen is nondistended, soft and nontender. Normal bowel sounds heard. Central nervous system:, More awake today but confused.  No focal neurological deficits. Moving extremities Extremities: No cyanosis, clubbing, edema    Data Reviewed: I have personally reviewed following labs and imaging studies  CBC: Recent Labs  Lab 08/05/17 1624 08/06/17 0207 08/07/17 0554  WBC 6.9 6.3 6.3  NEUTROABS 5.2  --  4.2  HGB 9.4* 9.1* 8.9*  HCT 30.0* 28.4* 28.2*  MCV 84.7 84.5 83.7  PLT 312 315  481   Basic Metabolic Panel: Recent Labs  Lab 08/05/17 1624 08/06/17 0207 08/07/17 0554  NA 129* 140 138  K 4.4 3.7 3.7  CL 93* 101 101  CO2 25 29 26   GLUCOSE 637* 61* 86  BUN 25* 21* 14  CREATININE 1.26* 1.00 0.92  CALCIUM 9.5 9.7 9.3  MG  --   --  1.4*   GFR: Estimated Creatinine Clearance: 60.3 mL/min (by C-G formula  based on SCr of 0.92 mg/dL). Liver Function Tests: Recent Labs  Lab 08/06/17 0207 08/07/17 0554  AST 49* 36  ALT 52 35  ALKPHOS 147* 129*  BILITOT 0.7 0.7  PROT 6.7 6.6  ALBUMIN 3.0* 2.7*   No results for input(s): LIPASE, AMYLASE in the last 168 hours. Recent Labs  Lab 08/06/17 0207  AMMONIA 17   Coagulation Profile: No results for input(s): INR, PROTIME in the last 168 hours. Cardiac Enzymes: Recent Labs  Lab 08/05/17 1955  TROPONINI <0.03   BNP (last 3 results) No results for input(s): PROBNP in the last 8760 hours. HbA1C: Recent Labs    08/07/17 0554  HGBA1C 10.6*   CBG: Recent Labs  Lab 08/06/17 2338 08/07/17 0346 08/07/17 0421 08/07/17 0747 08/07/17 1122  GLUCAP 222* 41* 105* 93 198*   Lipid Profile: No results for input(s): CHOL, HDL, LDLCALC, TRIG, CHOLHDL, LDLDIRECT in the last 72 hours. Thyroid Function Tests: Recent Labs    08/06/17 0207  TSH 2.315   Anemia Panel: Recent Labs    08/06/17 0207  VITAMINB12 600   Sepsis Labs: No results for input(s): PROCALCITON, LATICACIDVEN in the last 168 hours.  Recent Results (from the past 240 hour(s))  Urine culture     Status: Abnormal (Preliminary result)   Collection Time: 08/05/17  7:31 PM  Result Value Ref Range Status   Specimen Description URINE, RANDOM  Final   Special Requests NONE  Final   Culture (A)  Final    >=100,000 COLONIES/mL PROTEUS MIRABILIS SUSCEPTIBILITIES TO FOLLOW Performed at [facility], [address], [place], [place] [address]    Report Status PENDING  Incomplete         Radiology Studies: Dg Shoulder Right  Result Date: 08/05/2017 CLINICAL DATA:  Bruising about the anterior aspect of the right shoulder. The patient may have suffered a fall. Initial encounter. EXAM: RIGHT SHOULDER - 2+ VIEW COMPARISON:  None. FINDINGS: No acute bony or joint abnormality is seen. The patient has advanced acromioclavicular osteoarthritis. High-riding humeral head  compatible with chronic rotator cuff tear is noted. Image right lung and ribs appear normal. IMPRESSION: No acute finding. Advanced acromioclavicular osteoarthritis. Findings compatible with chronic rotator cuff tear. Electronically Signed   By: Inge Rise M.D.   On: 08/05/2017 17:16   Ct Head Wo Contrast  Result Date: 08/05/2017 CLINICAL DATA:  Altered mental status in a patient with a history of Alzheimer's. The patient was found on the floor today. EXAM: CT HEAD WITHOUT CONTRAST TECHNIQUE: Contiguous axial images were obtained from the base of the skull through the vertex without intravenous contrast. COMPARISON:  Head CT scan 04/24/2017. FINDINGS: Brain: No evidence of acute infarction, hemorrhage, hydrocephalus, extra-axial collection or mass lesion/mass effect. There is some cortical atrophy and chronic microvascular ischemic change. Vascular: Atherosclerosis noted. Skull: Intact. Sinuses/Orbits: Status post bilateral lens extraction. Other: None. IMPRESSION: No acute abnormality. Atrophy and chronic microvascular ischemic change. Atherosclerosis. Electronically Signed   By: Inge Rise M.D.   On: 08/05/2017 21:24   Mr Brain  Wo Contrast  Result Date: 08/06/2017 CLINICAL DATA:  Altered mental status. EXAM: MRI HEAD WITHOUT CONTRAST TECHNIQUE: Multiplanar, multiecho pulse sequences of the brain and surrounding structures were obtained without intravenous contrast. COMPARISON:  Head CT 08/05/2017 FINDINGS: Brain: There is no evidence of acute infarct, intracranial hemorrhage, mass, midline shift, or extra-axial fluid collection. There is mild-to-moderate cerebral atrophy. T2 hyperintensities in the periventricular white matter and pons are nonspecific but compatible with chronic small vessel ischemic disease, mild for age. Vascular: Major intracranial vascular flow voids are preserved. Skull and upper cervical spine: Unremarkable bone marrow signal. Sinuses/Orbits: Bilateral cataract  extraction. No significant sinus disease. Other: None. IMPRESSION: 1. No acute intracranial abnormality. 2. Mild chronic small vessel ischemic disease and mild-to-moderate cerebral atrophy. Electronically Signed   By: Logan Bores M.D.   On: 08/06/2017 12:04        Scheduled Meds: . amLODipine  5 mg Oral Daily  . aspirin EC  81 mg Oral BID  . benazepril  40 mg Oral Daily  . collagenase   Topical Daily  . dextrose      . enoxaparin (LOVENOX) injection  40 mg Subcutaneous Q24H  . enzalutamide  160 mg Oral Daily  . furosemide  40 mg Oral Daily  . insulin aspart  0-9 Units Subcutaneous Q4H  . memantine  5 mg Oral BID  . naphazoline-glycerin  1 drop Both Eyes BID  . trimethoprim-polymyxin b  1 drop Both Eyes Q4H   Continuous Infusions:    LOS: 2 days        Aline August, MD [facility] Pager [phone]  If 7PM-7AM, please contact night-coverage [email or web address] Password [email or web address] 08/07/2017, 1:16 PM

## 2017-08-08 ENCOUNTER — Other Ambulatory Visit: Payer: Self-pay

## 2017-08-08 ENCOUNTER — Encounter (HOSPITAL_COMMUNITY): Payer: Self-pay | Admitting: General Practice

## 2017-08-08 DIAGNOSIS — I252 Old myocardial infarction: Secondary | ICD-10-CM | POA: Diagnosis not present

## 2017-08-08 DIAGNOSIS — Z833 Family history of diabetes mellitus: Secondary | ICD-10-CM | POA: Diagnosis not present

## 2017-08-08 DIAGNOSIS — D649 Anemia, unspecified: Secondary | ICD-10-CM | POA: Diagnosis not present

## 2017-08-08 DIAGNOSIS — N182 Chronic kidney disease, stage 2 (mild): Secondary | ICD-10-CM | POA: Diagnosis not present

## 2017-08-08 DIAGNOSIS — F0150 Vascular dementia without behavioral disturbance: Secondary | ICD-10-CM

## 2017-08-08 DIAGNOSIS — E1122 Type 2 diabetes mellitus with diabetic chronic kidney disease: Secondary | ICD-10-CM | POA: Diagnosis not present

## 2017-08-08 DIAGNOSIS — E440 Moderate protein-calorie malnutrition: Secondary | ICD-10-CM

## 2017-08-08 DIAGNOSIS — Z7982 Long term (current) use of aspirin: Secondary | ICD-10-CM | POA: Diagnosis not present

## 2017-08-08 DIAGNOSIS — E11622 Type 2 diabetes mellitus with other skin ulcer: Secondary | ICD-10-CM | POA: Diagnosis not present

## 2017-08-08 DIAGNOSIS — M109 Gout, unspecified: Secondary | ICD-10-CM | POA: Diagnosis not present

## 2017-08-08 DIAGNOSIS — F0280 Dementia in other diseases classified elsewhere without behavioral disturbance: Secondary | ICD-10-CM | POA: Diagnosis not present

## 2017-08-08 DIAGNOSIS — Z79891 Long term (current) use of opiate analgesic: Secondary | ICD-10-CM | POA: Diagnosis not present

## 2017-08-08 DIAGNOSIS — E871 Hypo-osmolality and hyponatremia: Secondary | ICD-10-CM | POA: Diagnosis not present

## 2017-08-08 DIAGNOSIS — I2510 Atherosclerotic heart disease of native coronary artery without angina pectoris: Secondary | ICD-10-CM | POA: Diagnosis not present

## 2017-08-08 DIAGNOSIS — Z87891 Personal history of nicotine dependence: Secondary | ICD-10-CM | POA: Diagnosis not present

## 2017-08-08 DIAGNOSIS — G9341 Metabolic encephalopathy: Secondary | ICD-10-CM | POA: Diagnosis not present

## 2017-08-08 DIAGNOSIS — Z794 Long term (current) use of insulin: Secondary | ICD-10-CM | POA: Diagnosis not present

## 2017-08-08 DIAGNOSIS — Z6821 Body mass index (BMI) 21.0-21.9, adult: Secondary | ICD-10-CM | POA: Diagnosis not present

## 2017-08-08 DIAGNOSIS — I129 Hypertensive chronic kidney disease with stage 1 through stage 4 chronic kidney disease, or unspecified chronic kidney disease: Secondary | ICD-10-CM | POA: Diagnosis not present

## 2017-08-08 DIAGNOSIS — E1165 Type 2 diabetes mellitus with hyperglycemia: Secondary | ICD-10-CM | POA: Diagnosis not present

## 2017-08-08 DIAGNOSIS — L8990 Pressure ulcer of unspecified site, unspecified stage: Secondary | ICD-10-CM

## 2017-08-08 DIAGNOSIS — R739 Hyperglycemia, unspecified: Secondary | ICD-10-CM | POA: Diagnosis present

## 2017-08-08 DIAGNOSIS — L89890 Pressure ulcer of other site, unstageable: Secondary | ICD-10-CM | POA: Diagnosis not present

## 2017-08-08 DIAGNOSIS — L89110 Pressure ulcer of right upper back, unstageable: Secondary | ICD-10-CM | POA: Diagnosis not present

## 2017-08-08 DIAGNOSIS — R4182 Altered mental status, unspecified: Secondary | ICD-10-CM | POA: Diagnosis not present

## 2017-08-08 DIAGNOSIS — Z91018 Allergy to other foods: Secondary | ICD-10-CM | POA: Diagnosis not present

## 2017-08-08 LAB — CBC WITH DIFFERENTIAL/PLATELET
BASOS PCT: 0 %
Basophils Absolute: 0 10*3/uL (ref 0.0–0.1)
EOS ABS: 0.1 10*3/uL (ref 0.0–0.7)
EOS PCT: 1 %
HCT: 30 % — ABNORMAL LOW (ref 39.0–52.0)
Hemoglobin: 9.3 g/dL — ABNORMAL LOW (ref 13.0–17.0)
LYMPHS ABS: 1.1 10*3/uL (ref 0.7–4.0)
Lymphocytes Relative: 21 %
MCH: 26.3 pg (ref 26.0–34.0)
MCHC: 31 g/dL (ref 30.0–36.0)
MCV: 85 fL (ref 78.0–100.0)
MONOS PCT: 7 %
Monocytes Absolute: 0.4 10*3/uL (ref 0.1–1.0)
NEUTROS PCT: 71 %
Neutro Abs: 3.8 10*3/uL (ref 1.7–7.7)
PLATELETS: 335 10*3/uL (ref 150–400)
RBC: 3.53 MIL/uL — ABNORMAL LOW (ref 4.22–5.81)
RDW: 14.3 % (ref 11.5–15.5)
WBC: 5.3 10*3/uL (ref 4.0–10.5)

## 2017-08-08 LAB — URINE CULTURE: Culture: >=100000 — AB

## 2017-08-08 LAB — BASIC METABOLIC PANEL
Anion gap: 9 (ref 5–15)
BUN: 18 mg/dL (ref 6–20)
CALCIUM: 8.9 mg/dL (ref 8.9–10.3)
CO2: 26 mmol/L (ref 22–32)
CREATININE: 1.1 mg/dL (ref 0.61–1.24)
Chloride: 100 mmol/L — ABNORMAL LOW (ref 101–111)
Glucose, Bld: 380 mg/dL — ABNORMAL HIGH (ref 65–99)
Potassium: 3.8 mmol/L (ref 3.5–5.1)
SODIUM: 135 mmol/L (ref 135–145)

## 2017-08-08 LAB — MAGNESIUM: MAGNESIUM: 1.7 mg/dL (ref 1.7–2.4)

## 2017-08-08 LAB — GLUCOSE, CAPILLARY
GLUCOSE-CAPILLARY: 145 mg/dL — AB (ref 65–99)
GLUCOSE-CAPILLARY: 209 mg/dL — AB (ref 65–99)
GLUCOSE-CAPILLARY: 365 mg/dL — AB (ref 65–99)
Glucose-Capillary: 118 mg/dL — ABNORMAL HIGH (ref 65–99)

## 2017-08-08 MED ORDER — DOCUSATE SODIUM 100 MG PO CAPS: 100.0000 mg | ORAL_CAPSULE | Freq: Two times a day (BID) | ORAL | Status: AC | PRN

## 2017-08-08 MED ORDER — COLLAGENASE 250 UNIT/GM EX OINT: TOPICAL_OINTMENT | Freq: Every day | CUTANEOUS | 0 refills | Status: AC

## 2017-08-08 MED ORDER — ASPIRIN 81 MG PO TBEC: 81.0000 mg | DELAYED_RELEASE_TABLET | Freq: Every day | ORAL | Status: AC

## 2017-08-08 MED ORDER — PRO-STAT SUGAR FREE PO LIQD: 30.0000 mL | Freq: Two times a day (BID) | ORAL | 0 refills | Status: AC

## 2017-08-08 NOTE — Progress Notes (Signed)
CALLED [facility] TO GIVE REPORT, NO NURSE AVAILABLE TO GIVE REPORT TO. GAVE PH NUMBER TO DESK STAFF TO CALLBACK.

## 2017-08-08 NOTE — Care Management Note (Signed)
Case Management Note  Patient Details  Name: Carlos Schultz MRN: [id number] Date of Birth: 14-Jul-1940  Subjective/Objective:                    Action/Plan: Pt discharging to [facility] SNF. CM signing off.  Expected Discharge Date:  08/08/17               Expected Discharge Plan:  Skilled Nursing Facility  In-House Referral:  Clinical Social Work  Discharge planning Services     Post Acute Care Choice:    Choice offered to:     DME Arranged:    DME Agency:     HH Arranged:    [place] Agency:     Status of Service:  Completed, signed off  If discussed at [employer], dates discussed:    Additional Comments:  Pollie Friar, RN 08/08/2017, 1:59 PM

## 2017-08-08 NOTE — Progress Notes (Signed)
Need to contact daughter during day to find out what she would like to do concerning patients advanced directives etc..,

## 2017-08-08 NOTE — Care Management Important Message (Signed)
Important Message  Patient Details  Name: Carlos Schultz MRN: [id number] Date of Birth: 15-Dec-1940   Medicare Important Message Given:  Other (see comment)   Due to illness patient not able to sign/Unsigned copy left at bedside Orbie Pyo 08/08/2017, 2:11 PM

## 2017-08-08 NOTE — Discharge Summary (Addendum)
Physician Discharge Summary  Carlos Schultz WCH:[id number] DOB: May 04, 1941 DOA: 08/05/2017  PCP: Seward Carol, MD  Admit date: 08/05/2017 Discharge date: 08/08/2017  Admitted From: Home Disposition:  SNF  Recommendations for Outpatient Follow-up:  1. Follow up with nursing home provider at earliest convenience 2. Patient will benefit from evaluation and follow-up by palliative care in the nursing home 3. Fall precautions  Home Health: No Equipment/Devices: None  Discharge Condition: Guarded CODE STATUS: Full  Diet recommendation: Heart Healthy / Carb Modified / as per SLP recommendations  Brief/Interim Summary: 77 year old male with history of hypertension, diabetes mellitus type II, chronic kidney disease unknown stage, CAD, dementia presented with altered mental status and elevated blood sugar.  Initial CT of the brain was negative for any acute abnormality. MRI was negative for acute infarct. Patient had episodes of hyperglycemia and hypoglycemia.  Insulin was discontinued.  Overall prognosis is guarded to poor.  Palliative care recommends outpatient palliative care evaluation and follow-up.  He will be discharged to nursing home once bed is available.      Discharge Diagnoses:  Principal Problem:   Altered mental status Active Problems:   Type II diabetes mellitus with renal manifestations (HCC)   Anemia   Hyperglycemia   Vascular dementia without behavioral disturbance   Goals of care, counseling/discussion   Palliative care encounter   Malnutrition of moderate degree   Pressure injury of skin   Probable acute metabolic encephalopathy causing altered mental status -Probably secondary to hyperglycemia versus worsening dementia -MRI was negative for acute infarct. -Still confused. -Patient's baseline mental status is unknown.  -Monitor mental status.  Fall precautions -Ammonia, B12, TSH levels are normal. -Outpatient follow-up.  Diabetes mellitus type 2 with  hyperglycemia and hypoglycemia -Patient presented with hyperglycemia and subsequently had episodes of hypoglycemia.  Resume metformin on discharge.  Hold long-acting insulin and glimepiride -Hemoglobin A1c 10.6   Hyponatremia -Probably secondary to hyperglycemia.  Improved.    Hypomagnesemia -Replaced.    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    Anemia -Questionable cause.  No signs of bleeding.  Monitor  History of prostate cancer -Continue Xtandi  Dementia -Continue memantine -Patient looks very deconditioned and confused.  -Palliative care evaluated the patient and recommend outpatient evaluation and follow-up.  Overall prognosis is guarded to poor  History of hypertension and coronary artery disease - Continue aspirin, losartan and Lasix  Moderate malnutrition -Follow nutrition recommendations  Unstageable pressure injury to the right shoulder and unstageable pressure injuries to right lateral leg and right posterior leg present on admission -Follow wound care nurse's recommendations  Discharge Instructions  Discharge Instructions    Call MD for:  difficulty breathing, headache or visual disturbances   Complete by:  As directed    Call MD for:  extreme fatigue   Complete by:  As directed    Call MD for:  hives   Complete by:  As directed    Call MD for:  persistant dizziness or light-headedness   Complete by:  As directed    Call MD for:  persistant nausea and vomiting   Complete by:  As directed    Call MD for:  severe uncontrolled pain   Complete by:  As directed    Call MD for:  temperature >100.4   Complete by:  As directed    Diet - low sodium heart healthy   Complete by:  As directed    Diet Carb Modified   Complete by:  As directed  Discharge instructions   Complete by:  As directed    Diet as per SLP recommendations Fall precautions   Increase activity slowly   Complete by:  As directed      Allergies as of  08/08/2017      Reactions   Tomato Itching      Medication List    STOP taking these medications   glimepiride 4 MG tablet Commonly known as:  AMARYL   HUMALOG MIX 75/25 KWIKPEN (75-25) 100 UNIT/ML Kwikpen Generic drug:  Insulin Lispro Prot & Lispro   HYDROcodone-acetaminophen 5-325 MG tablet Commonly known as:  NORCO/VICODIN     TAKE these medications   ACCU-CHEK SMARTVIEW test strip Generic drug:  glucose blood 1 each by Other route as needed (for blood sugar).   amLODipine 5 MG tablet Commonly known as:  NORVASC Take 5 mg by mouth daily.   aspirin 81 MG EC tablet Take 1 tablet (81 mg total) by mouth daily.   benazepril 40 MG tablet Commonly known as:  LOTENSIN Take 40 mg by mouth daily.   CALCIUM + D3 PO Take 1 tablet daily by mouth.   cholecalciferol 400 units Tabs tablet Commonly known as:  VITAMIN D Take 400 Units by mouth daily.   collagenase ointment Commonly known as:  SANTYL Apply topically daily. Start taking on:  08/09/2017   docusate sodium 100 MG capsule Commonly known as:  COLACE Take 1 capsule (100 mg total) by mouth 2 (two) times daily as needed (constipation).   feeding supplement (PRO-STAT SUGAR FREE 64) Liqd Take 30 mLs by mouth 2 (two) times daily.   Fish Oil 1000 MG Caps Take 1,000 mg daily by mouth.   furosemide 40 MG tablet Commonly known as:  LASIX Take 40 mg by mouth daily.   memantine 5 MG tablet Commonly known as:  NAMENDA Take 5 mg 2 (two) times daily by mouth.   metFORMIN 500 MG tablet Commonly known as:  GLUCOPHAGE Take 500 mg by mouth 2 (two) times daily.   multivitamin with minerals Tabs tablet Take 1 tablet by mouth daily.   tetrahydrozoline 0.05 % ophthalmic solution Place 1 drop 2 (two) times daily into both eyes.   XTANDI 40 MG capsule Generic drug:  enzalutamide Take 160 mg daily by mouth.      Follow-up Information    Seward Carol, MD. Schedule an appointment as soon as possible for a visit in 1  week(s).   Specialty:  Internal Medicine Contact information: [address] [place] [address] [phone]        Palliative care. Schedule an appointment as soon as possible for a visit in 1 week(s).          Allergies  Allergen Reactions  . Tomato Itching    Consultations:  Palliative care   Procedures/Studies: Dg Shoulder Right  Result Date: 08/05/2017 CLINICAL DATA:  Bruising about the anterior aspect of the right shoulder. The patient may have suffered a fall. Initial encounter. EXAM: RIGHT SHOULDER - 2+ VIEW COMPARISON:  None. FINDINGS: No acute bony or joint abnormality is seen. The patient has advanced acromioclavicular osteoarthritis. High-riding humeral head compatible with chronic rotator cuff tear is noted. Image right lung and ribs appear normal. IMPRESSION: No acute finding. Advanced acromioclavicular osteoarthritis. Findings compatible with chronic rotator cuff tear. Electronically Signed   By: Inge Rise M.D.   On: 08/05/2017 17:16   Ct Head Wo Contrast  Result Date: 08/05/2017 CLINICAL DATA:  Altered mental status  in a patient with a history of Alzheimer's. The patient was found on the floor today. EXAM: CT HEAD WITHOUT CONTRAST TECHNIQUE: Contiguous axial images were obtained from the base of the skull through the vertex without intravenous contrast. COMPARISON:  Head CT scan 04/24/2017. FINDINGS: Brain: No evidence of acute infarction, hemorrhage, hydrocephalus, extra-axial collection or mass lesion/mass effect. There is some cortical atrophy and chronic microvascular ischemic change. Vascular: Atherosclerosis noted. Skull: Intact. Sinuses/Orbits: Status post bilateral lens extraction. Other: None. IMPRESSION: No acute abnormality. Atrophy and chronic microvascular ischemic change. Atherosclerosis. Electronically Signed   By: Inge Rise M.D.   On: 08/05/2017 21:24   Mr Brain Wo Contrast  Result Date: 08/06/2017 CLINICAL DATA:   Altered mental status. EXAM: MRI HEAD WITHOUT CONTRAST TECHNIQUE: Multiplanar, multiecho pulse sequences of the brain and surrounding structures were obtained without intravenous contrast. COMPARISON:  Head CT 08/05/2017 FINDINGS: Brain: There is no evidence of acute infarct, intracranial hemorrhage, mass, midline shift, or extra-axial fluid collection. There is mild-to-moderate cerebral atrophy. T2 hyperintensities in the periventricular white matter and pons are nonspecific but compatible with chronic small vessel ischemic disease, mild for age. Vascular: Major intracranial vascular flow voids are preserved. Skull and upper cervical spine: Unremarkable bone marrow signal. Sinuses/Orbits: Bilateral cataract extraction. No significant sinus disease. Other: None. IMPRESSION: 1. No acute intracranial abnormality. 2. Mild chronic small vessel ischemic disease and mild-to-moderate cerebral atrophy. Electronically Signed   By: Logan Bores M.D.   On: 08/06/2017 12:04      Subjective: Patient seen and examined at bedside.  He is awake but confused.  No overnight fever, nausea or vomiting.  Discharge Exam: Vitals:   08/08/17 0420 08/08/17 0736  BP: 124/64 (!) (P) 143/69  Pulse: 80 (P) 83  Resp: 18 (P) 18  Temp: 98.1 F (36.7 C) (P) 98.9 F (37.2 C)  SpO2: 100% (P) 100%   Vitals:   08/08/17 0012 08/08/17 0420 08/08/17 0600 08/08/17 0736  BP: 137/77 124/64  (!) (P) 143/69  Pulse: 89 80  (P) 83  Resp: 18 18  (P) 18  Temp: 98.1 F (36.7 C) 98.1 F (36.7 C)  (P) 98.9 F (37.2 C)  TempSrc: Oral Oral  (P) Oral  SpO2: 100% 100%  (P) 100%  Weight:   63.5 kg (139 lb 15.9 oz)   Height:        General: Pt is awake, confused. Cardiovascular: Rate controlled, S1/S2 + Respiratory: Bilateral decreased breath sounds at bases Abdominal: Soft, NT, ND, bowel sounds + Extremities: no edema, no cyanosis    The results of significant diagnostics from this hospitalization (including imaging, microbiology,  ancillary and laboratory) are listed below for reference.     Microbiology: Recent Results (from the past 240 hour(s))  Urine culture     Status: Abnormal   Collection Time: 08/05/17  7:31 PM  Result Value Ref Range Status   Specimen Description URINE, RANDOM  Final   Special Requests   Final    NONE Performed at [facility], [address] [place], [place] [address]    Culture >=100,000 COLONIES/mL PROTEUS MIRABILIS (A)  Final   Report Status 08/08/2017 FINAL  Final   Organism ID, Bacteria PROTEUS MIRABILIS (A)  Final      Susceptibility   Proteus mirabilis - MIC*    AMPICILLIN <=2 SENSITIVE Sensitive     CEFAZOLIN <=4 SENSITIVE Sensitive     CEFTRIAXONE <=1 SENSITIVE Sensitive     CIPROFLOXACIN <=0.25 SENSITIVE Sensitive  GENTAMICIN <=1 SENSITIVE Sensitive     IMIPENEM 2 SENSITIVE Sensitive     NITROFURANTOIN 128 RESISTANT Resistant     TRIMETH/SULFA <=20 SENSITIVE Sensitive     AMPICILLIN/SULBACTAM <=2 SENSITIVE Sensitive     PIP/TAZO <=4 SENSITIVE Sensitive     * >=100,000 COLONIES/mL PROTEUS MIRABILIS     Labs: BNP (last 3 results) No results for input(s): BNP in the last 8760 hours. Basic Metabolic Panel: Recent Labs  Lab 08/05/17 1624 08/06/17 0207 08/07/17 0554 08/08/17 0925  NA 129* 140 138 135  K 4.4 3.7 3.7 3.8  CL 93* 101 101 100*  CO2 25 29 26 26   GLUCOSE 637* 61* 86 380*  BUN 25* 21* 14 18  CREATININE 1.26* 1.00 0.92 1.10  CALCIUM 9.5 9.7 9.3 8.9  MG  --   --  1.4* 1.7   Liver Function Tests: Recent Labs  Lab 08/06/17 0207 08/07/17 0554  AST 49* 36  ALT 52 35  ALKPHOS 147* 129*  BILITOT 0.7 0.7  PROT 6.7 6.6  ALBUMIN 3.0* 2.7*   No results for input(s): LIPASE, AMYLASE in the last 168 hours. Recent Labs  Lab 08/06/17 0207  AMMONIA 17   CBC: Recent Labs  Lab 08/05/17 1624 08/06/17 0207 08/07/17 0554  WBC 6.9 6.3 6.3  NEUTROABS 5.2  --  4.2  HGB 9.4* 9.1* 8.9*  HCT 30.0* 28.4* 28.2*  MCV 84.7 84.5 83.7  PLT 312 315  336   Cardiac Enzymes: Recent Labs  Lab 08/05/17 1955  TROPONINI <0.03   BNP: Invalid input(s): POCBNP CBG: Recent Labs  Lab 08/07/17 1557 08/07/17 2001 08/08/17 0009 08/08/17 0418 08/08/17 0727  GLUCAP 226* 133* 145* 118* 209*   D-Dimer No results for input(s): DDIMER in the last 72 hours. Hgb A1c Recent Labs    08/07/17 0554  HGBA1C 10.6*   Lipid Profile No results for input(s): CHOL, HDL, LDLCALC, TRIG, CHOLHDL, LDLDIRECT in the last 72 hours. Thyroid function studies Recent Labs    08/06/17 0207  TSH 2.315   Anemia work up Recent Labs    08/06/17 0207  VITAMINB12 600   Urinalysis    Component Value Date/Time   COLORURINE STRAW (A) 08/05/2017 1931   APPEARANCEUR CLEAR 08/05/2017 1931   LABSPEC 1.018 08/05/2017 1931   PHURINE 6.0 08/05/2017 1931   GLUCOSEU >=500 (A) 08/05/2017 1931   HGBUR NEGATIVE 08/05/2017 1931   BILIRUBINUR NEGATIVE 08/05/2017 1931   KETONESUR NEGATIVE 08/05/2017 1931   PROTEINUR NEGATIVE 08/05/2017 1931   UROBILINOGEN 0.2 05/24/2014 1200   NITRITE NEGATIVE 08/05/2017 1931   LEUKOCYTESUR NEGATIVE 08/05/2017 1931   Sepsis Labs Invalid input(s): PROCALCITONIN,  WBC,  LACTICIDVEN Microbiology Recent Results (from the past 240 hour(s))  Urine culture     Status: Abnormal   Collection Time: 08/05/17  7:31 PM  Result Value Ref Range Status   Specimen Description URINE, RANDOM  Final   Special Requests   Final    NONE Performed at [facility], [place] [address], [place], [place] [address]    Culture >=100,000 COLONIES/mL PROTEUS MIRABILIS (A)  Final   Report Status 08/08/2017 FINAL  Final   Organism ID, Bacteria PROTEUS MIRABILIS (A)  Final      Susceptibility   Proteus mirabilis - MIC*    AMPICILLIN <=2 SENSITIVE Sensitive     CEFAZOLIN <=4 SENSITIVE Sensitive     CEFTRIAXONE <=1 SENSITIVE Sensitive     CIPROFLOXACIN <=0.25 SENSITIVE Sensitive     GENTAMICIN <=1 SENSITIVE Sensitive  IMIPENEM 2 SENSITIVE Sensitive      NITROFURANTOIN 128 RESISTANT Resistant     TRIMETH/SULFA <=20 SENSITIVE Sensitive     AMPICILLIN/SULBACTAM <=2 SENSITIVE Sensitive     PIP/TAZO <=4 SENSITIVE Sensitive     * >=100,000 COLONIES/mL PROTEUS MIRABILIS     Time coordinating discharge:66minutes  SIGNED:   Aline August, MD  [facility] 08/08/2017, 10:55 AM Pager: [phone]  If 7PM-7AM, please contact night-coverage [email or web address] Password [email or web address]

## 2017-08-08 NOTE — Clinical Social Work Placement (Signed)
Nurse to call report to [facility]  NOTE  Date:  08/08/2017  Patient Details  Name: Carlos Schultz MRN: [id number] Date of Birth: 03/23/1941  Clinical Social Work is seeking post-discharge placement for this patient at the [place] level of care (*CSW will initial, date and re-position this form in  chart as items are completed):  Yes   Patient/family provided with [facility] Work Department's list of facilities offering this level of care within the geographic area requested by the patient (or if unable, by the patient's family).  Yes   Patient/family informed of their freedom to choose among providers that offer the needed level of care, that participate in Medicare, Medicaid or managed care program needed by the patient, have an available bed and are willing to accept the patient.  Yes   Patient/family informed of [facility]'s ownership interest in [facility] and [facility], as well as of the fact that they are under no obligation to receive care at these facilities.  PASRR submitted to EDS on 08/06/17     PASRR number received on 08/06/17     Existing PASRR number confirmed on       FL2 transmitted to all facilities in geographic area requested by pt/family on 08/06/17     FL2 transmitted to all facilities within larger geographic area on       Patient informed that his/her managed care company has contracts with or will negotiate with certain facilities, including the following:        Yes   Patient/family informed of bed offers received.  Patient [facility] bed at [facility]     Physician recommends and patient chooses bed at      Patient to be transferred to [facility] on 08/08/17.  Patient to be transferred to facility by [facility]     Patient family notified on 08/08/17 of transfer.  Name of family member notified:  Doris     PHYSICIAN       Additional Comment:     _______________________________________________ Geralynn Ochs, LCSW 08/08/2017, 12:15 PM

## 2017-08-08 NOTE — Plan of Care (Signed)
Not progressing with ambulation

## 2017-08-08 NOTE — Progress Notes (Signed)
OT Cancellation Note  Patient Details Name: Carlos Schultz MRN: [id number] DOB: May 28, 1941   Cancelled Treatment:    Reason Eval/Treat Not Completed: Other (comment). Pt is Medicare and current D/C plan is SNF. No apparent immediate acute care OT needs, therefore will defer OT to SNF. If OT eval is needed please call Acute Rehab Dept. at [phone] or text page OT at [phone].    [place], OT/L  [phone] 08/08/2017 08/08/2017, 12:15 PM

## 2017-08-11 LAB — FOLATE RBC
FOLATE, RBC: 1852 ng/mL (ref >498)
Folate, Hemolysate: 533.3 ng/mL
Hematocrit: 28.8 % — ABNORMAL LOW (ref 37.5–51.0)

## 2017-09-08 DEATH — deceased
